# Patient Record
Sex: Female | Born: 1965 | Race: Black or African American | Hispanic: No | Marital: Single | Smoking: Never smoker
Health system: Southern US, Community
[De-identification: ages and names within clinical notes are randomized; demographics above are authoritative.]

## PROBLEM LIST (undated history)

## (undated) DIAGNOSIS — M549 Dorsalgia, unspecified: Secondary | ICD-10-CM

## (undated) DIAGNOSIS — K8020 Calculus of gallbladder without cholecystitis without obstruction: Secondary | ICD-10-CM

## (undated) DIAGNOSIS — E0500 Thyrotoxicosis with diffuse goiter without thyrotoxic crisis or storm: Secondary | ICD-10-CM

## (undated) DIAGNOSIS — M1990 Unspecified osteoarthritis, unspecified site: Secondary | ICD-10-CM

## (undated) DIAGNOSIS — K219 Gastro-esophageal reflux disease without esophagitis: Secondary | ICD-10-CM

## (undated) DIAGNOSIS — I10 Essential (primary) hypertension: Secondary | ICD-10-CM

## (undated) DIAGNOSIS — E039 Hypothyroidism, unspecified: Secondary | ICD-10-CM

## (undated) DIAGNOSIS — R609 Edema, unspecified: Secondary | ICD-10-CM

## (undated) HISTORY — PX: OTHER SURGICAL HISTORY: SHX169

## (undated) HISTORY — PX: TUBAL LIGATION: SHX77

## (undated) HISTORY — DX: Calculus of gallbladder without cholecystitis without obstruction: K80.20

## (undated) HISTORY — DX: Dorsalgia, unspecified: M54.9

## (undated) HISTORY — DX: Unspecified osteoarthritis, unspecified site: M19.90

## (undated) HISTORY — PX: CHOLECYSTECTOMY: SHX55

## (undated) HISTORY — DX: Morbid (severe) obesity due to excess calories: E66.01

---

## 2014-04-03 ENCOUNTER — Encounter (HOSPITAL_COMMUNITY): Payer: Self-pay | Admitting: Emergency Medicine

## 2014-04-03 DIAGNOSIS — R51 Headache: Secondary | ICD-10-CM | POA: Insufficient documentation

## 2014-04-03 DIAGNOSIS — H53149 Visual discomfort, unspecified: Secondary | ICD-10-CM | POA: Insufficient documentation

## 2014-04-03 DIAGNOSIS — K219 Gastro-esophageal reflux disease without esophagitis: Secondary | ICD-10-CM | POA: Insufficient documentation

## 2014-04-03 DIAGNOSIS — Z7982 Long term (current) use of aspirin: Secondary | ICD-10-CM | POA: Insufficient documentation

## 2014-04-03 DIAGNOSIS — Z79899 Other long term (current) drug therapy: Secondary | ICD-10-CM | POA: Insufficient documentation

## 2014-04-03 DIAGNOSIS — E0500 Thyrotoxicosis with diffuse goiter without thyrotoxic crisis or storm: Secondary | ICD-10-CM | POA: Insufficient documentation

## 2014-04-03 DIAGNOSIS — R110 Nausea: Secondary | ICD-10-CM | POA: Insufficient documentation

## 2014-04-03 DIAGNOSIS — E039 Hypothyroidism, unspecified: Secondary | ICD-10-CM | POA: Insufficient documentation

## 2014-04-03 DIAGNOSIS — I10 Essential (primary) hypertension: Secondary | ICD-10-CM | POA: Insufficient documentation

## 2014-04-03 NOTE — ED Notes (Signed)
Left sided H/a's. Started yesterday. Took some bayer asa and cool compress. Also. Took Excedrin. No relief.

## 2014-04-04 ENCOUNTER — Emergency Department (HOSPITAL_COMMUNITY)
Admission: EM | Admit: 2014-04-04 | Discharge: 2014-04-04 | Disposition: A | Discharge status: Home / Self Care | Payer: BC Managed Care – PPO | Attending: Emergency Medicine | Admitting: Emergency Medicine

## 2014-04-04 DIAGNOSIS — R519 Headache, unspecified: Secondary | ICD-10-CM

## 2014-04-04 DIAGNOSIS — R51 Headache: Secondary | ICD-10-CM

## 2014-04-04 HISTORY — DX: Edema, unspecified: R60.9

## 2014-04-04 HISTORY — DX: Hypothyroidism, unspecified: E03.9

## 2014-04-04 HISTORY — DX: Thyrotoxicosis with diffuse goiter without thyrotoxic crisis or storm: E05.00

## 2014-04-04 HISTORY — DX: Gastro-esophageal reflux disease without esophagitis: K21.9

## 2014-04-04 HISTORY — DX: Essential (primary) hypertension: I10

## 2014-04-04 MED ORDER — OXYCODONE HCL 5 MG PO TABS: 5.0000 mg | ORAL_TABLET | ORAL | Status: DC | PRN

## 2014-04-04 MED ORDER — HYDROMORPHONE HCL PF 1 MG/ML IJ SOLN
1.0000 mg | Freq: Once | INTRAMUSCULAR | Status: AC
  Administered 2014-04-04: 1 mg via INTRAVENOUS
  Filled 2014-04-04: qty 1

## 2014-04-04 MED ORDER — SODIUM CHLORIDE 0.9 % IV BOLUS (SEPSIS)
1000.0000 mL | Freq: Once | INTRAVENOUS | Status: AC
  Administered 2014-04-04: 1000 mL via INTRAVENOUS

## 2014-04-04 MED ORDER — NAPROXEN 500 MG PO TABS: 500.0000 mg | ORAL_TABLET | Freq: Two times a day (BID) | ORAL | Status: DC

## 2014-04-04 MED ORDER — KETOROLAC TROMETHAMINE 30 MG/ML IJ SOLN
30.0000 mg | Freq: Once | INTRAMUSCULAR | Status: AC
  Administered 2014-04-04: 30 mg via INTRAVENOUS
  Filled 2014-04-04: qty 1

## 2014-04-04 MED ORDER — ONDANSETRON HCL 4 MG/2ML IJ SOLN
4.0000 mg | Freq: Once | INTRAMUSCULAR | Status: AC
  Administered 2014-04-04: 4 mg via INTRAVENOUS
  Filled 2014-04-04: qty 2

## 2014-04-04 NOTE — ED Provider Notes (Signed)
CSN: [id number]     Arrival date & time 04/03/14  2009 History   First MD Initiated Contact with Patient 04/04/14 0206     Chief Complaint  Patient presents with  . Headache     (Consider location/radiation/quality/duration/timing/severity/associated sxs/prior Treatment) HPI Comments: 48 year old female with a frequent headache history presents with headache that started yesterday. She describes it as a pulsating left-sided headache which becomes severe at times. It waxes and wanes in intensity and it improved initially with Excedrin. The pain then recurred and has been constant since that time. The pain starts in the temporal area and radiates into the face, down into the jaw and up above the cheek. This becomes severe when the cheek is touched, when she opens her mouth or when she feels wind blow across her face. She reports a history of having headaches once or twice a year, and they do not feel like this, she denies dental pain but does have photosensitivity and nausea. She denies fevers or chills, no stiff neck, no weakness numbness or visual changes. She describes her pain as 8/10 at this time.  Patient is a 48 y.o. female presenting with headaches. The history is provided by the patient.  Headache   Past Medical History  Diagnosis Date  . Hypothyroidism   . Graves disease   . Hypertension   . Fluid retention   . Acid reflux    Past Surgical History  Procedure Laterality Date  . Cholecystectomy    . Tubal ligation     No family history on file. History  Substance Use Topics  . Smoking status: Never Smoker   . Smokeless tobacco: Not on file  . Alcohol Use: No   OB History   Grav Para Term Preterm Abortions TAB SAB Ect Mult Living                 Review of Systems  Neurological: Positive for headaches.  All other systems reviewed and are negative.     Allergies  Tylenol  Home Medications   Prior to Admission medications   Medication Sig Start Date End Date  Taking? Authorizing Provider  amLODipine (NORVASC) 5 MG tablet Take 5 mg by mouth daily.   Yes Historical Provider, MD  aspirin EC 81 MG tablet Take 162 mg by mouth once.   Yes Historical Provider, MD  aspirin-acetaminophen-caffeine (EXCEDRIN MIGRAINE) [phone] MG per tablet Take 1 tablet by mouth every 6 (six) hours as needed for headache.   Yes Historical Provider, MD  busPIRone (BUSPAR) 10 MG tablet Take 10 mg by mouth daily as needed (anxiety).   Yes Historical Provider, MD  Cholecalciferol (VITAMIN D-3) 5000 UNITS TABS Take 5,000 Units by mouth daily.   Yes Historical Provider, MD  ferrous fumarate (HEMOCYTE - 106 MG FE) 325 (106 FE) MG TABS tablet Take 1 tablet by mouth daily as needed (iron deficiency).   Yes Historical Provider, MD  hydrochlorothiazide (HYDRODIURIL) 25 MG tablet Take 25 mg by mouth daily.   Yes Historical Provider, MD  levothyroxine (SYNTHROID, LEVOTHROID) 112 MCG tablet Take 224 mcg by mouth daily before breakfast.   Yes Historical Provider, MD  pantoprazole (PROTONIX) 40 MG tablet Take 40 mg by mouth daily as needed (acid reflux).   Yes Historical Provider, MD   BP 120/72  Pulse 57  Temp(Src) 98.2 F (36.8 C) (Oral)  Resp 16  SpO2 97% Physical Exam  Nursing note and vitals reviewed. Constitutional: She appears well-developed and well-nourished. No distress.  HENT:  Head: Normocephalic and atraumatic.  Mouth/Throat: Oropharynx is clear and moist. No oropharyngeal exudate.  Severe tenderness to even light touch of the cheek, the temporal area or the lower left jaw. Dentition appears intact, mucous membranes are moist  Eyes: Conjunctivae and EOM are normal. Pupils are equal, round, and reactive to light. Right eye exhibits no discharge. Left eye exhibits no discharge. No scleral icterus.  Neck: Normal range of motion. Neck supple. No JVD present. No thyromegaly present.  Cardiovascular: Normal rate, regular rhythm, normal heart sounds and intact distal pulses.   Exam reveals no gallop and no friction rub.   No murmur heard. Pulmonary/Chest: Effort normal and breath sounds normal. No respiratory distress. She has no wheezes. She has no rales.  Abdominal: Soft. Bowel sounds are normal. She exhibits no distension and no mass. There is no tenderness.  Musculoskeletal: Normal range of motion. She exhibits no edema and no tenderness.  Lymphadenopathy:    She has no cervical adenopathy.  Neurological: She is alert. Coordination normal.  Normal speech strength and sensation, follows commands without difficulty, normal coordination, cranial nerves III through XII are intact, hypersensitivity to the left side of the face is present  Skin: Skin is warm and dry. No rash noted. No erythema.  Psychiatric: She has a normal mood and affect. Her behavior is normal.    ED Course  Procedures (including critical care time) Labs Review Labs Reviewed - No data to display  Imaging Review No results found.    MDM   Final diagnoses:  Headache    The patient has signs and symptoms consistent with a possible headache syndrome such as tic douloureux, would also consider migraine, medications as below doubt a pathologic source of the headache such as intracranial hemorrhage, mass, stroke  Improved to 2/10 after meds, referral to neuro, meds for hoem as below.  Meds given in ED:  Medications  ketorolac (TORADOL) 30 MG/ML injection 30 mg (30 mg Intravenous Given 04/04/14 0246)  HYDROmorphone (DILAUDID) injection 1 mg (1 mg Intravenous Given 04/04/14 0247)  sodium chloride 0.9 % bolus 1,000 mL (1,000 mLs Intravenous New Bag/Given 04/04/14 0246)  ondansetron (ZOFRAN) injection 4 mg (4 mg Intravenous Given 04/04/14 0244)    New Prescriptions   NAPROXEN (NAPROSYN) 500 MG TABLET    Take 1 tablet (500 mg total) by mouth 2 (two) times daily with a meal.   OXYCODONE (ROXICODONE) 5 MG IMMEDIATE RELEASE TABLET    Take 1 tablet (5 mg total) by mouth every 4 (four) hours as needed  for severe pain.        Johnna Acosta, MD 04/04/14 [phone]

## 2014-04-04 NOTE — Discharge Instructions (Signed)
Headache:  You are having a headache. No specific cause was found today for your headache. It may have been a migraine or other cause of headache. Stress, anxiety, fatigue, and depression are common triggers for headaches. Your headache today does not appear to be life-threatening or require hospitalization, but often the exact cause of headaches is not determined in the emergency department. Therefore, followup with your doctor is very important to find out what may have caused your headache, and whether or not you need any further diagnostic testing or treatment. Sometimes headaches can appear benign but then more serious symptoms can develop which should prompt an immediate reevaluation by your doctor or the emergency department.  Seek immediate medical attention if:  You develop possible problems with medications prescribed. The medications don't resolve your headache, if it recurs, or if you have multiple episodes of vomiting or can't take fluids by mouth You have a change from the usual headache. If you developed a sudden severe headache or confusion, become poorly responsive or faint, developed a fever above 100.4 or problems breathing, have a change in speech, vision, swallowing or understanding, or developed new weakness, numbness, tingling, incoordination or have a seizure.  If you don't have a family doctor to follow up with, see the follow up list below - call this morning for a follow-up appointment in the next 1-2 days.  RESOURCE GUIDE  Dental Problems  Patients with Medicaid: [facility]                     [facility] [address]                                           [address] Phone:  [phone]                                                  Phone:  [phone]  If unable to pay or uninsured, contact:  [facility] or [facility] to become qualified for the adult dental clinic.  Chronic Pain Problems Contact [facility]  [phone] Patients need to be referred by their primary care doctor.  Insufficient Money for Medicine Contact [employer]:  call "211" or [facility] 271-5999.  No Primary Care Doctor Call [facility]  832-8000 Other agencies that provide inexpensive medical care    [facility]  [phone]    [facility]  [phone]    [facility]  [phone]    [facility] Clinic  [phone]    [facility]  [phone]    [facility]  [phone]  Psychological Services [facility]  [phone] [facility]  [phone] [facility]   800 853-5163 (emergency services [phone])  Substance Abuse Resources [facility]  336-882-2125 [facility] 336-784-9470 [facility] 336-285-9073 [facility] 336-845-3988 Residential & Outpatient Substance Abuse Program  800-659-3381  Abuse/Neglect [place] Child Abuse Hotline (336) 641-3795 [place] Child Abuse Hotline 800-378-5315 (After Hours)  Emergency Shelter [facility] (336) 271-5985  [facility] (336) 275-9566 [facility] (704) 372-4663  MRSA Hotline #:     832-7006    [place] [facility]  [facility] [place]     [employer]                          [facility] [address] [place]                       [address]      [address]  [place]                                                [place]                            [place] Phone:  [phone]                                   Phone:  [phone]                 Phone:  [phone]  [facility] Phone:  [phone]  [place] Child Abuse Hotline (336) 342-1394 (336) 342-3537 (After Hours)   

## 2014-04-04 NOTE — ED Notes (Signed)
MD at bedside. 

## 2015-06-23 ENCOUNTER — Encounter: Payer: Self-pay | Admitting: Gastroenterology

## 2015-06-23 ENCOUNTER — Telehealth: Payer: Self-pay | Admitting: Adult Health

## 2015-06-23 ENCOUNTER — Encounter: Payer: Self-pay | Admitting: Adult Health

## 2015-06-23 ENCOUNTER — Ambulatory Visit (INDEPENDENT_AMBULATORY_CARE_PROVIDER_SITE_OTHER): Payer: No Typology Code available for payment source | Admitting: Adult Health

## 2015-06-23 VITALS — BP 120/70 | Temp 98.4°F | Ht 66.0 in | Wt 307.9 lb

## 2015-06-23 DIAGNOSIS — Z7689 Persons encountering health services in other specified circumstances: Secondary | ICD-10-CM

## 2015-06-23 DIAGNOSIS — I10 Essential (primary) hypertension: Secondary | ICD-10-CM | POA: Diagnosis not present

## 2015-06-23 DIAGNOSIS — E039 Hypothyroidism, unspecified: Secondary | ICD-10-CM | POA: Diagnosis not present

## 2015-06-23 DIAGNOSIS — Z23 Encounter for immunization: Secondary | ICD-10-CM

## 2015-06-23 DIAGNOSIS — Z1239 Encounter for other screening for malignant neoplasm of breast: Secondary | ICD-10-CM

## 2015-06-23 LAB — TSH: TSH: 1.51 u[IU]/mL (ref 0.35–4.50)

## 2015-06-23 NOTE — Progress Notes (Signed)
HPI:  Cheryl Stone is here to establish care. She is a pleasant AA, non smoking female with  has a past medical history of Hypothyroidism; Graves disease; Hypertension; Fluid retention; and Acid reflux.  Last PCP and physical:Unknown  Has the following chronic problems that require follow up and concerns today:  Hypothyroidism  - She had Graves Disease in 1999 and was treated with two rounds of iodine. This caused her to become hypo and she started taking synthroid, her levels are not well controlled. She continues to be fatigued and chilled.   Left Knee Pain  - for 5.5 years. Takes Naproxen, which does not help. She is inquiring about injections.  HTN - Endorses being well controlled on current medications.     ROS negative for unless reported above: fevers, chills,feeling poorly, unintentional weight loss, hearing or vision loss, chest pain, palpitations, leg claudication, struggling to breath,Not feeling congested in the chest, no orthopenia, no cough,no wheezing, normal appetite, no soft tissue swelling, no hemoptysis, melena, hematochezia, hematuria, falls, loc, si, or thoughts of self harm.  Immunizations:UTD Diet: Eats healthy Exercise:Walks and other cardiology  Colonoscopy:Scheduled Pap Smear: No abnormal. Will schedule a gyn appointment Mammogram: Scheduled  Past Medical History  Diagnosis Date  . Hypothyroidism   . Graves disease   . Hypertension   . Fluid retention   . Acid reflux     Past Surgical History  Procedure Laterality Date  . Cholecystectomy    . Tubal ligation    . Ablation      History reviewed. No pertinent family history.  History   Social History  . Marital Status: Single    Spouse Name: N/A  . Number of Children: N/A  . Years of Education: N/A   Social History Main Topics  . Smoking status: Never Smoker   . Smokeless tobacco: Not on file  . Alcohol Use: No  . Drug Use: No  . Sexual Activity: Not on file   Other Topics  Concern  . None   Social History Narrative     Current outpatient prescriptions:  .  amLODipine (NORVASC) 5 MG tablet, Take 5 mg by mouth daily., Disp: , Rfl:  .  Cholecalciferol (D-5000) 5000 UNITS TABS, Take 1 tablet by mouth daily., Disp: , Rfl:  .  ferrous fumarate (HEMOCYTE - 106 MG FE) 325 (106 FE) MG TABS tablet, Take 1 tablet by mouth daily as needed (iron deficiency)., Disp: , Rfl:  .  hydrochlorothiazide (HYDRODIURIL) 25 MG tablet, Take 25 mg by mouth daily., Disp: , Rfl:  .  levothyroxine (SYNTHROID, LEVOTHROID) 112 MCG tablet, Take 224 mcg by mouth daily before breakfast., Disp: , Rfl:  .  aspirin EC 81 MG tablet, Take 162 mg by mouth once., Disp: , Rfl:  .  Cholecalciferol (VITAMIN D-3) 5000 UNITS TABS, Take 5,000 Units by mouth daily., Disp: , Rfl:  .  famotidine (PEPCID) 20 MG tablet, Take 1 tablet by mouth 2 (two) times daily., Disp: , Rfl:  .  naproxen (NAPROSYN) 500 MG tablet, Take 1 tablet (500 mg total) by mouth 2 (two) times daily with a meal., Disp: 30 tablet, Rfl: 0  EXAM:  Filed Vitals:   06/23/15 1357  BP: 120/70  Temp: 98.4 F (36.9 C)    Body mass index is 49.72 kg/(m^2).  GENERAL: vitals reviewed and listed above, alert, oriented, appears well hydrated and in no acute distress. Severely obese.  HEENT: atraumatic, conjunttiva clear, no obvious abnormalities on inspection of external nose and  ears. Multiple missing teeth  NECK: Neck is soft and supple without masses, no adenopathy or thyromegaly, trachea midline, no JVD. Normal range of motion.   LUNGS: clear to auscultation bilaterally, no wheezes, rales or rhonchi, good air movement  CV: Regular rate and rhythm, normal S1/S2, no audible murmurs, gallops, or rubs. No carotid bruit and no peripheral edema.   MS: moves all extremities without noticeable abnormality. No edema noted. Pain to right knee, right medial aspect with palpation. No bruising, swelling or warmth noted  Abd:  soft/nontender/nondistended/normal bowel sounds   Skin: warm and dry, no rash   Extremities: No clubbing, cyanosis, or edema. Capillary refill is WNL. Pulses intact bilaterally in upper and lower extremities.   Neuro: CN II-XII intact, sensation and reflexes normal throughout, 5/5 muscle strength in bilateral upper and lower extremities. Normal finger to nose. Normal rapid alternating movements.   PSYCH: pleasant and cooperative, no obvious depression or anxiety  ASSESSMENT AND PLAN:   1. Encounter to establish care - Follow up in as soon as possible for CPE - Ambulatory referral to Gastroenterology  2. Essential hypertension - Controlled with current medication regimen  3. Hypothyroidism, unspecified hypothyroidism type - TSH - Will follow up on labs - Consider Endocrine referral.   4. Breast cancer screening - MM Digital Screening; Future    No diagnosis found. -We reviewed the PMH, PSH, FH, SH, Meds and Allergies. -We provided refills for any medications we will prescribe as needed. -We addressed current concerns per orders and patient instructions. -We have asked for records for pertinent exams, studies, vaccines and notes from previous providers. -We have advised patient to follow up per instructions below.   -Patient advised to return or notify a provider immediately if symptoms worsen or persist or new concerns arise.  There are no Patient Instructions on file for this visit.   [employer]

## 2015-06-23 NOTE — Patient Instructions (Signed)
It was great meeting you today and I am happy to have you on the team!   I will follow up with you regarding your labs.    Please follow up with me at your convenience for your physical.   If you need anything in the mean time, please let me know.

## 2015-06-23 NOTE — Telephone Encounter (Signed)
Patient informed on her TSH level. No change in medications.

## 2015-07-06 ENCOUNTER — Telehealth: Payer: Self-pay | Admitting: Adult Health

## 2015-07-06 NOTE — Telephone Encounter (Signed)
Pt said she saw Tommi Rumps for the first time a couple weeks ago and is asking if he will refill the following meds   levothyroxine (SYNTHROID, LEVOTHROID) 112 MCG tablet , hydrochlorothiazide (HYDRODIURIL) 25 MG tablet  amLODipine (NORVASC) 5 MG tablet    [facility]

## 2015-07-07 MED ORDER — AMLODIPINE BESYLATE 5 MG PO TABS: 5.0000 mg | ORAL_TABLET | Freq: Every day | ORAL | Status: DC

## 2015-07-07 MED ORDER — LEVOTHYROXINE SODIUM 112 MCG PO TABS: 224.0000 ug | ORAL_TABLET | Freq: Every day | ORAL | Status: DC

## 2015-07-07 MED ORDER — HYDROCHLOROTHIAZIDE 25 MG PO TABS: 25.0000 mg | ORAL_TABLET | Freq: Every day | ORAL | Status: DC

## 2015-07-07 NOTE — Telephone Encounter (Signed)
Rx sent to pharmacy   

## 2015-07-09 ENCOUNTER — Encounter: Payer: Self-pay | Admitting: Adult Health

## 2015-07-10 ENCOUNTER — Telehealth: Payer: Self-pay | Admitting: Adult Health

## 2015-07-10 NOTE — Telephone Encounter (Signed)
Patient called in wanting to know if the MD would be able to call in her medications for thyroid(Synthryoid), Norvas, and [facility] to [facility] [address]. Please give patient a call.

## 2015-07-10 NOTE — Telephone Encounter (Signed)
Called and spoke with Roseanne and the prescriptions are ready for pick up.  Rx was sent electronically on 07/07/2015.  Pt needs to bring in her insurance card.  Called and spoke with pt and pt is aware. Pt states she will call gynecology to make an appt for a pap.  Pt will come in on 8.24.2016 for a annual exam with [facility].

## 2015-07-29 ENCOUNTER — Other Ambulatory Visit (HOSPITAL_COMMUNITY)
Admission: RE | Admit: 2015-07-29 | Discharge: 2015-07-29 | Disposition: A | Discharge status: Home / Self Care | Payer: No Typology Code available for payment source | Source: Ambulatory Visit | Attending: Adult Health | Admitting: Adult Health

## 2015-07-29 ENCOUNTER — Ambulatory Visit (INDEPENDENT_AMBULATORY_CARE_PROVIDER_SITE_OTHER): Payer: No Typology Code available for payment source | Admitting: Adult Health

## 2015-07-29 ENCOUNTER — Telehealth: Payer: Self-pay

## 2015-07-29 ENCOUNTER — Encounter: Payer: Self-pay | Admitting: Adult Health

## 2015-07-29 VITALS — BP 122/80 | Temp 98.3°F | Ht 66.0 in | Wt 305.2 lb

## 2015-07-29 DIAGNOSIS — Z01419 Encounter for gynecological examination (general) (routine) without abnormal findings: Secondary | ICD-10-CM | POA: Insufficient documentation

## 2015-07-29 DIAGNOSIS — Z0000 Encounter for general adult medical examination without abnormal findings: Secondary | ICD-10-CM | POA: Diagnosis not present

## 2015-07-29 DIAGNOSIS — M25562 Pain in left knee: Secondary | ICD-10-CM

## 2015-07-29 DIAGNOSIS — Z23 Encounter for immunization: Secondary | ICD-10-CM | POA: Diagnosis not present

## 2015-07-29 DIAGNOSIS — E039 Hypothyroidism, unspecified: Secondary | ICD-10-CM

## 2015-07-29 DIAGNOSIS — Z113 Encounter for screening for infections with a predominantly sexual mode of transmission: Secondary | ICD-10-CM | POA: Insufficient documentation

## 2015-07-29 DIAGNOSIS — I10 Essential (primary) hypertension: Secondary | ICD-10-CM

## 2015-07-29 DIAGNOSIS — Z1239 Encounter for other screening for malignant neoplasm of breast: Secondary | ICD-10-CM

## 2015-07-29 DIAGNOSIS — Z0001 Encounter for general adult medical examination with abnormal findings: Secondary | ICD-10-CM

## 2015-07-29 DIAGNOSIS — N760 Acute vaginitis: Secondary | ICD-10-CM | POA: Insufficient documentation

## 2015-07-29 DIAGNOSIS — M25561 Pain in right knee: Secondary | ICD-10-CM

## 2015-07-29 MED ORDER — MELOXICAM 15 MG PO TABS: 15.0000 mg | ORAL_TABLET | Freq: Every day | ORAL | Status: DC | PRN

## 2015-07-29 MED ORDER — PHENTERMINE HCL 15 MG PO CAPS: 15.0000 mg | ORAL_CAPSULE | ORAL | Status: DC

## 2015-07-29 NOTE — Progress Notes (Addendum)
Subjective:    Patient ID: Cheryl Stone, female    DOB: 07-02-1966, 49 y.o.   MRN: [id number]  HPI  49 year old female who presents today for her complete physical. She  has a past medical history of Hypothyroidism; Graves disease; Hypertension; Fluid retention; Acid reflux; Back pain; and Osteoarthritis.  She endorses that everything is going well except for her weight loss goals. She is exercising and is eating a healthy diet but does not seem to be losing weight. She is down two pounds since last visit. She would like to try other therapies before inquiring about gastric bypass surgery.   She has her colonoscopy scheduled for next month and is going to schedule her breast exam.   Her only other complaint is continued left and  right knee pain.Her knee pain originally started in the left knee a few months ago, she has been compensating with her right knee which has caused it to hurt.  She is inquiring about injections or pain medication she can take for this. She has tried tylenol and ibuprofen in the past without much success. She has full range of motion and is able to bare weight without difficulty.     Review of Systems  Constitutional: Negative.   HENT: Negative.   Eyes: Negative.   Respiratory: Negative.   Cardiovascular: Positive for leg swelling.  Gastrointestinal: Negative.   Endocrine: Negative.   Genitourinary: Negative.   Musculoskeletal: Positive for myalgias and arthralgias. Negative for back pain, gait problem, neck pain and neck stiffness.  Skin: Negative.   Allergic/Immunologic: Negative.   Neurological: Negative.   Hematological: Negative.   Psychiatric/Behavioral: Negative.   All other systems reviewed and are negative.      Objective:   Physical Exam  GENERAL: vitals reviewed and listed above, alert, oriented, appears well hydrated and in no acute distress. Severely obese.  HEENT: atraumatic, conjunttiva clear, no obvious abnormalities on inspection of  external nose and ears. Multiple missing teeth  NECK: Neck is soft and supple without masses, no adenopathy or thyromegaly, trachea midline, no JVD. Normal range of motion.   LUNGS: clear to auscultation bilaterally, no wheezes, rales or rhonchi, good air movement  CV: Regular rate and rhythm, normal S1/S2, no audible murmurs, gallops, or rubs. No carotid bruit and no peripheral edema.   MS: moves all extremities without noticeable abnormality. No edema noted. Pain to right and left knees, right medial aspect with palpation. No bruising, swelling or warmth noted  Abd: soft/nontender/nondistended/normal bowel sounds   Genitourinary: Uterus normal. Guaiac negative stool. Vaginal discharge (on menstral cycle) found.  Breast Exam: No lumps, masses, discharge, or dimpling noted. Soreness mid sternal left breast.     Skin: warm and dry, no rash   Extremities: No clubbing, cyanosis, or edema. Capillary refill is WNL. Pulses intact bilaterally in upper and lower extremities.   Neuro: CN II-XII intact, sensation and reflexes normal throughout, 5/5 muscle strength in bilateral upper and lower extremities. Normal finger to nose. Normal rapid alternating movements.   PSYCH: pleasant and cooperative, no obvious depression or anxiety      Assessment & Plan:  1. Annual physical exam - Basic metabolic panel - CBC with Differential/Platelet - Hemoglobin A1c - Hepatic function panel - Lipid panel - TSH - POCT urinalysis dipstick - EKG 12-Lead-- NSR, Rate 65 - Acute Hep Panel & Hep B Surface Ab - HIV antibody - HSV(herpes smplx)abs-1+2(IgG+IgM)-bld - RPR - Cytology - PAP  2. Essential hypertension - Continue with  current medication therapy-  no change - Basic metabolic panel - Hemoglobin A1c - Lipid panel - EKG 12-Lead  3. Hypothyroidism, unspecified hypothyroidism type - TSH - Consider changing dose once labs are back  4. Breast cancer screening - MM DIGITAL SCREENING BILATERAL;  Future  5. Morbid obesity - phentermine 15 MG capsule; Take 1 capsule (15 mg total) by mouth every morning.  Dispense: 30 capsule; Refill: 0 - Will use for 12 weeks depending on weight loss - Consider referral to bariatric RN  7. Arthralgia of both knees - meloxicam (MOBIC) 15 MG tablet; Take 1 tablet (15 mg total) by mouth daily as needed for pain.  Dispense: 30 tablet; Refill: 3 - DG Knee 1-2 Views Left; Future - Will consider steroid injections

## 2015-07-29 NOTE — Telephone Encounter (Signed)
Pt called and would like to know what you decided on the knee injection? Pt wanted to know if she could have something for pain that will not make her feel bad. Pls advise.

## 2015-07-29 NOTE — Progress Notes (Signed)
Pre visit review using our clinic review tool, if applicable. No additional management support is needed unless otherwise documented below in the visit note. 

## 2015-07-29 NOTE — Telephone Encounter (Signed)
Called and spoke with pt and pt is aware.  Pt states the knee problems started with the left knee initially, and now the right hurts a little due to pt compensating. Advised pt that an order for her knee would be ordered.  [facility]

## 2015-07-29 NOTE — Addendum Note (Signed)
Addended by: Apolinar Junes on: 07/29/2015 11:45 AM   Modules accepted: Orders, SmartSet

## 2015-07-29 NOTE — Patient Instructions (Signed)
It was great seeing you again!  I will follow up with you regarding your blood work.   Start the phentermine and continue with diet and exercise. If you have any chest pain, dizziness or SOB stop the medication. If you feel jittery, then cut the pill in half and take one half before breakfast and at lunch.   Let me know if you need anything!

## 2015-07-29 NOTE — Telephone Encounter (Signed)
We can do an injection and see if it helps. I would like her to go get an xray of her knee first. I will send in Mobic 15 mg, take daily as needed for pain.  If she wants to get the xray, let me know and I will put it in.   Thanks

## 2015-07-30 LAB — CYTOLOGY - PAP

## 2015-07-31 ENCOUNTER — Telehealth: Payer: Self-pay | Admitting: Adult Health

## 2015-07-31 ENCOUNTER — Other Ambulatory Visit: Payer: Self-pay | Admitting: Adult Health

## 2015-07-31 LAB — CERVICOVAGINAL ANCILLARY ONLY
BACTERIAL VAGINITIS: NEGATIVE
Candida vaginitis: NEGATIVE

## 2015-07-31 MED ORDER — METRONIDAZOLE 500 MG PO TABS: ORAL_TABLET | ORAL | Status: DC

## 2015-07-31 NOTE — Telephone Encounter (Signed)
Spoke to patient on the phone and advised her of the positive Trichomonas result. Sent in 2 g Flagyl one time dose. Advised that other partners should be treated and not to drink any alcohol for 72 hours after taking.

## 2015-08-03 ENCOUNTER — Encounter: Payer: Self-pay | Admitting: Obstetrics and Gynecology

## 2015-08-04 ENCOUNTER — Telehealth: Payer: Self-pay

## 2015-08-04 NOTE — Telephone Encounter (Signed)
Called and spoke with pt and pt is aware.  Lab appt made of 8.31.2016. Pt is aware.

## 2015-08-04 NOTE — Telephone Encounter (Signed)
-----   Message from Dorothyann Peng, NP sent at 08/04/2015  7:51 AM EDT ----- She never had her blood work drawn. Can we get her to come back one morning and do this?

## 2015-08-05 ENCOUNTER — Encounter: Payer: Self-pay | Admitting: Adult Health

## 2015-08-05 ENCOUNTER — Other Ambulatory Visit: Payer: No Typology Code available for payment source

## 2015-08-05 LAB — HEPATIC FUNCTION PANEL
ALK PHOS: 53 U/L (ref 39–117)
ALT: 12 U/L (ref 0–35)
AST: 14 U/L (ref 0–37)
Albumin: 3.8 g/dL (ref 3.5–5.2)
BILIRUBIN DIRECT: 0.1 mg/dL (ref 0.0–0.3)
BILIRUBIN TOTAL: 0.4 mg/dL (ref 0.2–1.2)
Total Protein: 7.8 g/dL (ref 6.0–8.3)

## 2015-08-05 LAB — BASIC METABOLIC PANEL
BUN: 15 mg/dL (ref 6–23)
CO2: 32 mEq/L (ref 19–32)
CREATININE: 0.77 mg/dL (ref 0.40–1.20)
Calcium: 9.5 mg/dL (ref 8.4–10.5)
Chloride: 100 mEq/L (ref 96–112)
GFR: 101.7 mL/min (ref >60.00)
Glucose, Bld: 92 mg/dL (ref 70–99)
Potassium: 3.4 mEq/L — ABNORMAL LOW (ref 3.5–5.1)
Sodium: 139 mEq/L (ref 135–145)

## 2015-08-05 LAB — LIPID PANEL
CHOL/HDL RATIO: 4
CHOLESTEROL: 171 mg/dL (ref 0–200)
HDL: 42.8 mg/dL (ref >39.00)
LDL CALC: 113 mg/dL — AB (ref 0–99)
NONHDL: 127.88
Triglycerides: 73 mg/dL (ref 0.0–149.0)
VLDL: 14.6 mg/dL (ref 0.0–40.0)

## 2015-08-05 LAB — POCT URINALYSIS DIPSTICK
BILIRUBIN UA: NEGATIVE
Blood, UA: NEGATIVE
Glucose, UA: NEGATIVE
KETONES UA: NEGATIVE
Leukocytes, UA: NEGATIVE
Nitrite, UA: NEGATIVE
PH UA: 7
Protein, UA: NEGATIVE
SPEC GRAV UA: 1.015
Urobilinogen, UA: 0.2

## 2015-08-05 LAB — CBC WITH DIFFERENTIAL/PLATELET
BASOS ABS: 0 10*3/uL (ref 0.0–0.1)
Basophils Relative: 0.4 % (ref 0.0–3.0)
EOS ABS: 0.1 10*3/uL (ref 0.0–0.7)
Eosinophils Relative: 2.3 % (ref 0.0–5.0)
HCT: 44.6 % (ref 36.0–46.0)
HEMOGLOBIN: 15.1 g/dL — AB (ref 12.0–15.0)
Lymphocytes Relative: 43.5 % (ref 12.0–46.0)
Lymphs Abs: 1.4 10*3/uL (ref 0.7–4.0)
MCHC: 33.8 g/dL (ref 30.0–36.0)
MCV: 83.2 fl (ref 78.0–100.0)
MONO ABS: 0.2 10*3/uL (ref 0.1–1.0)
Monocytes Relative: 7.5 % (ref 3.0–12.0)
Neutro Abs: 1.5 10*3/uL (ref 1.4–7.7)
Neutrophils Relative %: 46.3 % (ref 43.0–77.0)
Platelets: 201 10*3/uL (ref 150.0–400.0)
RBC: 5.36 Mil/uL — ABNORMAL HIGH (ref 3.87–5.11)
RDW: 15.5 % (ref 11.5–15.5)
WBC: 3.2 10*3/uL — AB (ref 4.0–10.5)

## 2015-08-05 LAB — TSH: TSH: 0.48 u[IU]/mL (ref 0.35–4.50)

## 2015-08-05 LAB — HEMOGLOBIN A1C: HEMOGLOBIN A1C: 5.8 % (ref 4.6–6.5)

## 2015-08-06 LAB — ACUTE HEP PANEL AND HEP B SURFACE AB
HCV Ab: NEGATIVE
HEP A IGM: NONREACTIVE
HEP B S AG: NEGATIVE
Hep B C IgM: NONREACTIVE
Hep B S Ab: NEGATIVE

## 2015-08-06 LAB — HIV ANTIBODY (ROUTINE TESTING W REFLEX): HIV 1&2 Ab, 4th Generation: NONREACTIVE

## 2015-08-06 LAB — RPR

## 2015-08-07 LAB — HSV(HERPES SMPLX)ABS-I+II(IGG+IGM)-BLD
HSV 1 Glycoprotein G Ab, IgG: 6.99 IV — ABNORMAL HIGH
HSV 2 GLYCOPROTEIN G AB, IGG: 2.66 IV — AB
Herpes Simplex Vrs I&II-IgM Ab (EIA): 0.59 INDEX

## 2015-08-09 ENCOUNTER — Encounter: Payer: Self-pay | Admitting: Adult Health

## 2015-08-19 ENCOUNTER — Ambulatory Visit (AMBULATORY_SURGERY_CENTER): Payer: Self-pay

## 2015-08-19 VITALS — Ht 66.0 in | Wt 303.8 lb

## 2015-08-19 DIAGNOSIS — Z1211 Encounter for screening for malignant neoplasm of colon: Secondary | ICD-10-CM

## 2015-08-19 MED ORDER — MOVIPREP 100 G PO SOLR: 1.0000 | Freq: Once | ORAL | Status: DC

## 2015-08-19 NOTE — Progress Notes (Signed)
No allergies to eggs or soy No past problems with anesthesia No home oxygen DOES TAKE PHENTERMINE; TOLD TO STOP FOR 10 DAYS  Has email  Emmi instructions given for colonoscopy

## 2015-09-02 ENCOUNTER — Ambulatory Visit (AMBULATORY_SURGERY_CENTER): Payer: No Typology Code available for payment source | Admitting: Gastroenterology

## 2015-09-02 ENCOUNTER — Encounter: Payer: Self-pay | Admitting: Gastroenterology

## 2015-09-02 VITALS — BP 112/74 | HR 60 | Temp 97.5°F | Resp 20 | Ht 66.0 in | Wt 303.0 lb

## 2015-09-02 DIAGNOSIS — D123 Benign neoplasm of transverse colon: Secondary | ICD-10-CM | POA: Diagnosis not present

## 2015-09-02 DIAGNOSIS — Z1211 Encounter for screening for malignant neoplasm of colon: Secondary | ICD-10-CM | POA: Diagnosis not present

## 2015-09-02 DIAGNOSIS — D125 Benign neoplasm of sigmoid colon: Secondary | ICD-10-CM

## 2015-09-02 MED ORDER — SODIUM CHLORIDE 0.9 % IV SOLN: 500.0000 mL | INTRAVENOUS | Status: DC

## 2015-09-02 NOTE — Progress Notes (Signed)
Called to room to assist during endoscopic procedure.  Patient ID and intended procedure confirmed with present staff. Received instructions for my participation in the procedure from the performing physician.  

## 2015-09-02 NOTE — Patient Instructions (Signed)
YOU HAD AN ENDOSCOPIC PROCEDURE TODAY AT THE [facility]:   Refer to the procedure report that was given to you for any specific questions about what was found during the examination.  If the procedure report does not answer your questions, please call your gastroenterologist to clarify.  If you requested that your care partner not be given the details of your procedure findings, then the procedure report has been included in a sealed envelope for you to review at your convenience later.  YOU SHOULD EXPECT: Some feelings of bloating in the abdomen. Passage of more gas than usual.  Walking can help get rid of the air that was put into your GI tract during the procedure and reduce the bloating. If you had a lower endoscopy (such as a colonoscopy or flexible sigmoidoscopy) you may notice spotting of blood in your stool or on the toilet paper. If you underwent a bowel prep for your procedure, you may not have a normal bowel movement for a few days.  Please Note:  You might notice some irritation and congestion in your nose or some drainage.  This is from the oxygen used during your procedure.  There is no need for concern and it should clear up in a day or so.  SYMPTOMS TO REPORT IMMEDIATELY:   Following lower endoscopy (colonoscopy or flexible sigmoidoscopy):  Excessive amounts of blood in the stool  Significant tenderness or worsening of abdominal pains  Swelling of the abdomen that is new, acute  Fever of 100F or higher  For urgent or emergent issues, a gastroenterologist can be reached at any hour by calling [phone].   DIET: Your first meal following the procedure should be a small meal and then it is ok to progress to your normal diet. Heavy or fried foods are harder to digest and may make you feel nauseous or bloated.  Likewise, meals heavy in dairy and vegetables can increase bloating.  Drink plenty of fluids but you should avoid alcoholic beverages for 24  hours.  ACTIVITY:  You should plan to take it easy for the rest of today and you should NOT DRIVE or use heavy machinery until tomorrow (because of the sedation medicines used during the test).    FOLLOW UP: Our staff will call the number listed on your records the next business day following your procedure to check on you and address any questions or concerns that you may have regarding the information given to you following your procedure. If we do not reach you, we will leave a message.  However, if you are feeling well and you are not experiencing any problems, there is no need to return our call.  We will assume that you have returned to your regular daily activities without incident.  If any biopsies were taken you will be contacted by phone or by letter within the next 1-3 weeks.  Please call us at [phone] if you have not heard about the biopsies in 3 weeks.    SIGNATURES/CONFIDENTIALITY: You and/or your care partner have signed paperwork which will be entered into your electronic medical record.  These signatures attest to the fact that that the information above on your After Visit Summary has been reviewed and is understood.  Full responsibility of the confidentiality of this discharge information lies with you and/or your care-partner.  Please review polyp handout provided. Next colonoscopy determined by pathology results; 5 or 10 years. 

## 2015-09-02 NOTE — Op Note (Signed)
[place]  [address] [place], [id number]   COLONOSCOPY PROCEDURE REPORT  PATIENT: Cheryl Stone, Cheryl Stone  MR#: [id number] BIRTHDATE: 03/25/1966 , 48  yrs. old GENDER: female ENDOSCOPIST: Milus Banister, MD REFERRED XL:KGMW Nafzinger, MD PROCEDURE DATE:  09/02/2015 PROCEDURE:   Colonoscopy, screening, Colonoscopy with biopsy, and Colonoscopy with snare polypectomy First Screening Colonoscopy - Avg.  risk and is 50 yrs.  old or older Yes.  Prior Negative Screening - Now for repeat screening. N/A  History of Adenoma - Now for follow-up colonoscopy & has been > or = to 3 yrs.  N/A  Polyps removed today? Yes ASA CLASS:   Class II INDICATIONS:Screening for colonic neoplasia and average risk patient for colon cancer. MEDICATIONS: Monitored anesthesia care and Propofol 200 mg IV  DESCRIPTION OF PROCEDURE:   After the risks benefits and alternatives of the procedure were thoroughly explained, informed consent was obtained.  The digital rectal exam revealed no abnormalities of the rectum.   The LB [id number]  endoscope was introduced through the anus and advanced to the cecum, which was identified by both the appendix and ileocecal valve. No adverse events experienced.   The quality of the prep was excellent.  The instrument was then slowly withdrawn as the colon was fully examined. Estimated blood loss is zero unless otherwise noted in this procedure report.  COLON FINDINGS: A sessile polyp measuring 3 mm in size was found in the transverse colon.  A polypectomy was performed with a cold snare.  The resection was complete, the polyp tissue was completely retrieved and sent to histology.   A sessile polyp measuring 2 mm in size was found in the sigmoid colon.  A polypectomy was performed with cold forceps.  The resection was complete, the polyp tissue was completely retrieved and sent to histology.   The examination was otherwise normal.  Retroflexed views revealed  no abnormalities. The time to cecum = 0.9 Withdrawal time = 10.0   The scope was withdrawn and the procedure completed. COMPLICATIONS: There were no immediate complications.  ENDOSCOPIC IMPRESSION: 1.   Sessile polyp was found in the transverse colon; polypectomy was performed with a cold snare 2.   Sessile polyp was found in the sigmoid colon; polypectomy was performed with cold forceps 3.   The examination was otherwise normal  RECOMMENDATIONS: If the polyp(s) removed today are proven to be adenomatous (pre-cancerous) polyps, you will need a repeat colonoscopy in 5 years.  Otherwise you should continue to follow colorectal cancer screening guidelines for "routine risk" patients with colonoscopy in 10 years.  You will receive a letter within 1-2 weeks with the results of your biopsy as well as final recommendations.  Please call my office if you have not received a letter after 3 weeks.  eSigned:  Milus Banister, MD 09/02/2015 10:07 AM

## 2015-09-02 NOTE — Progress Notes (Signed)
Report to PACU, RN, vss, BBS= Clear.  

## 2015-09-03 ENCOUNTER — Telehealth: Payer: Self-pay | Admitting: *Deleted

## 2015-09-03 NOTE — Telephone Encounter (Signed)
  Follow up Call-  Call back number 09/02/2015  Post procedure Call Back phone  # [phone]  Permission to leave phone message Yes     Patient questions:  Do you have a fever, pain , or abdominal swelling? No. Pain Score  0 *  Have you tolerated food without any problems? Yes.    Have you been able to return to your normal activities? Yes.    Do you have any questions about your discharge instructions: Diet   No. Medications  No. Follow up visit  No.  Do you have questions or concerns about your Care? No.  Actions: * If pain score is 4 or above: No action needed, pain <4.

## 2015-09-07 ENCOUNTER — Other Ambulatory Visit: Payer: Self-pay | Admitting: Adult Health

## 2015-09-07 ENCOUNTER — Encounter: Payer: Self-pay | Admitting: Gastroenterology

## 2015-09-07 NOTE — Telephone Encounter (Signed)
Please advise. Sent pt a message to ask her current weight and if she was having any side effects.

## 2015-09-08 NOTE — Telephone Encounter (Signed)
Pls advise.  

## 2015-09-09 MED ORDER — PHENTERMINE HCL 37.5 MG PO CAPS: 37.5000 mg | ORAL_CAPSULE | ORAL | Status: DC

## 2015-09-09 MED ORDER — PHENTERMINE HCL 15 MG PO CAPS: 15.0000 mg | ORAL_CAPSULE | ORAL | Status: DC

## 2015-09-09 NOTE — Telephone Encounter (Signed)
D/c phentermine 15 mg and ordered 37.5.

## 2015-09-09 NOTE — Telephone Encounter (Signed)
Increase to 37.5 mg

## 2015-09-09 NOTE — Addendum Note (Signed)
Addended by: Colleen Can on: 09/09/2015 09:52 AM   Modules accepted: Orders, Medications

## 2015-09-18 ENCOUNTER — Encounter: Payer: Self-pay | Admitting: Obstetrics and Gynecology

## 2015-10-01 ENCOUNTER — Other Ambulatory Visit: Payer: Self-pay | Admitting: Adult Health

## 2015-10-14 ENCOUNTER — Encounter: Payer: Self-pay | Admitting: Adult Health

## 2015-10-15 ENCOUNTER — Other Ambulatory Visit: Payer: Self-pay | Admitting: Adult Health

## 2015-10-15 MED ORDER — PHENTERMINE HCL 30 MG PO CAPS: 30.0000 mg | ORAL_CAPSULE | ORAL | Status: DC

## 2015-11-05 ENCOUNTER — Encounter: Payer: Self-pay | Admitting: Obstetrics and Gynecology

## 2015-11-20 ENCOUNTER — Encounter: Payer: Self-pay | Admitting: Obstetrics and Gynecology

## 2015-11-23 ENCOUNTER — Encounter: Payer: Self-pay | Admitting: Adult Health

## 2016-01-09 ENCOUNTER — Other Ambulatory Visit: Payer: Self-pay | Admitting: Adult Health

## 2016-01-12 ENCOUNTER — Other Ambulatory Visit: Payer: Self-pay | Admitting: Adult Health

## 2016-01-13 NOTE — Telephone Encounter (Signed)
Ok to refill 

## 2016-01-13 NOTE — Telephone Encounter (Signed)
Ok to refill for 6 months 

## 2016-01-15 ENCOUNTER — Encounter: Payer: Self-pay | Admitting: Endocrinology

## 2016-01-15 ENCOUNTER — Ambulatory Visit (INDEPENDENT_AMBULATORY_CARE_PROVIDER_SITE_OTHER): Payer: BLUE CROSS/BLUE SHIELD | Admitting: Endocrinology

## 2016-01-15 VITALS — BP 120/86 | HR 93 | Temp 98.3°F | Resp 20 | Ht 66.0 in | Wt 309.0 lb

## 2016-01-15 DIAGNOSIS — E041 Nontoxic single thyroid nodule: Secondary | ICD-10-CM

## 2016-01-15 DIAGNOSIS — E890 Postprocedural hypothyroidism: Secondary | ICD-10-CM

## 2016-01-15 LAB — T4, FREE: FREE T4: 1.63 ng/dL — AB (ref 0.60–1.60)

## 2016-01-15 LAB — TSH: TSH: 0.24 u[IU]/mL — AB (ref 0.35–4.50)

## 2016-01-15 NOTE — Progress Notes (Signed)
Pre visit review using our clinic review tool, if applicable. No additional management support is needed unless otherwise documented below in the visit note. 

## 2016-01-15 NOTE — Progress Notes (Signed)
Patient ID: Cheryl Stone, female   DOB: Feb 19, 1966, 50 y.o.   MRN: [id number]            Reason for Appointment:  Hypothyroidism, new visit    History of Present Illness:    Hypothyroidism was first diagnosed in 03/1998  At that time patient was having symptoms of  fatigue, cold sensitivity and weight loss. She was found to be hyperthyroid and she was also having significant difficulty swallowing. She was told that her thyroid was the size of a golf ball and she was having Graves' disease She was given radioactive iodine  Subsequently her labs showed that she was hypothyroid and she was started on thyroid supplements.  At that time she was feeling fairly well. She was followed regularly before she moved here and apparently her levothyroxine dose has been increased stepwise About 2 years ago she was taking 200 g and now she has been taking a stable dose of 112 g, 2 tablets daily .           The patient has been followed locally in 2016 only She has been feeling fairly well with her energy level is usually.  No significant weight change or cold intolerance No dry skin She is wanting more information about hypothyroidism  THYROID nodule: She was sent for her thyroid ultrasound in 2015 and she was not told why. This showed a mostly solid 1.5 cm nodule on the right side.  No further evaluation was done         Patient's weight history is as follows:  Wt Readings from Last 3 Encounters:  01/15/16 309 lb (140.161 kg)  09/02/15 303 lb (137.44 kg)  08/19/15 303 lb 12.8 oz (137.803 kg)    Thyroid function results have been as follows:  Lab Results  Component Value Date   FREET4 1.63* 01/15/2016   TSH 0.24* 01/15/2016   TSH 0.48 08/05/2015   TSH 1.51 06/23/2015     Past Medical History  Diagnosis Date  . Hypothyroidism   . Graves disease     1999 had iodine radiation  . Hypertension   . Fluid retention   . Acid reflux   . Back pain   . Osteoarthritis     Past Surgical  History  Procedure Laterality Date  . Cholecystectomy    . Tubal ligation    . Uterine ablation      Family History  Problem Relation Age of Onset  . Sudden Cardiac Death Mother     Saw her sister get killed, which led to heart attack  . Hypertension Maternal Grandmother   . Alzheimer's disease Maternal Grandfather   . Colon cancer Neg Hx   . Thyroid disease Paternal Grandmother     Social History:  reports that she has never smoked. She has never used smokeless tobacco. She reports that she drinks alcohol. She reports that she does not use illicit drugs.  Allergies:  Allergies  Allergen Reactions  . Lobster [Shellfish Allergy] Rash    Lobster  . Tylenol [Acetaminophen] Hives and Swelling      Medication List       This list is accurate as of: 01/15/16 11:59 PM.  Always use your most recent med list.               amLODipine 5 MG tablet  Commonly known as:  NORVASC  TAKE 1 TABLET (5 MG TOTAL) BY MOUTH DAILY.     aspirin EC 81 MG tablet  Take  162 mg by mouth once.     D-5000 5000 units Tabs  Generic drug:  Cholecalciferol  Take 1 tablet by mouth daily.     ferrous fumarate 325 (106 Fe) MG Tabs tablet  Commonly known as:  HEMOCYTE - 106 mg FE  Take 1 tablet by mouth daily as needed (iron deficiency).     hydrochlorothiazide 25 MG tablet  Commonly known as:  HYDRODIURIL  TAKE 1 TABLET (25 MG TOTAL) BY MOUTH DAILY.     levothyroxine 112 MCG tablet  Commonly known as:  SYNTHROID, LEVOTHROID  TAKE 2 TABLETS (224 MCG TOTAL) BY MOUTH DAILY BEFORE BREAKFAST.     meloxicam 15 MG tablet  Commonly known as:  MOBIC  TAKE 1 TABLET (15 MG TOTAL) BY MOUTH DAILY AS NEEDED FOR PAIN.     phentermine 30 MG capsule  Take 1 capsule (30 mg total) by mouth every morning.          Review of Systems  Constitutional: Positive for weight loss.       She has had difficulty with weight loss.  Her PCP has given her phentermine.  Because of her feeling jittery with this she  does not take this daily.  Has only lost about 7 pounds with this.  Has not had any nutritional consultation  HENT: Negative for headaches.   Eyes:       No prominence of the eyes currently or previously, no redness or irritation  Respiratory: Negative for shortness of breath.   Cardiovascular: Positive for leg swelling. Negative for palpitations.       Mostly on the left  Gastrointestinal: Negative for diarrhea.  Endocrine: Negative for fatigue and cold intolerance.  Musculoskeletal: Positive for joint pain.  Neurological: Negative for numbness.  Psychiatric/Behavioral: Negative for nervousness.                Examination:    BP 120/86 mmHg  Pulse 93  Temp(Src) 98.3 F (36.8 C) (Oral)  Resp 20  Ht 5\' 6"  (1.676 m)  Wt 309 lb (140.161 kg)  BMI 49.90 kg/m2  SpO2 98%  LMP   GENERAL: Generalized obesity present  Average build.   No pallor, clubbing, lymphadenopathy.  Skin:  no rash or pigmentation.  EYES:  No prominence of the eyes or swelling of the eyelids  ENT: Oral mucosa and tongue normal.  THYROID:  Not palpable.  HEART:  Normal  S1 and S2; no murmur or click.  CHEST:    Lungs: Vescicular breath sounds heard equally.  No crepitations/ wheeze.  ABDOMEN:  No distention.  Liver and spleen not palpable.  No other mass or tenderness.  NEUROLOGICAL: Reflexes are bilaterally normal at biceps.  JOINTS:  Normal peripheral joints.  Extremities: 1+ left lower leg edema   Assessment:  HYPOTHYROIDISM, post reactive iodine treatment, reportedly for Graves' disease However not clear if she may have had a toxic nodule and no previous records are available Although she is taking relatively large doses of Synthroid of 224 g this appears appropriate for her weight She is quite compliant with taking the medication before breakfast daily She is clinically doing well with no unusual fatigue or hypothyroid symptoms  1.5 cm dominant nodule in the right lobe demonstrated in  2015.  Not clear this is a remnant of a previous nodule or a new nodule  PLAN:   Check thyroid levels and decide on dosage  Repeat thyroid ultrasound to evaluate thyroid nodule.  If the nodule is unchanged smaller  may not need any further ultrasound exams  Follow-up to be decided on results of her labs and ultrasound   [facility] 01/16/2016, 7:07 PM   Addendum: Thyroid levels as follows.  Will reduce her medication dose by 1 tablet weekly  Lab Results  Component Value Date   TSH 0.24* 01/15/2016

## 2016-01-16 ENCOUNTER — Encounter: Payer: Self-pay | Admitting: Endocrinology

## 2016-01-16 NOTE — Progress Notes (Signed)
Quick Note:  Please let patient know that the thyroid level is a little high Will need to have her take 2 tablets on 6 days and only one tablet on the seventh day Change follow-up visit to 3 months instead of 6 months ______

## 2016-01-18 ENCOUNTER — Other Ambulatory Visit: Payer: Self-pay

## 2016-01-18 DIAGNOSIS — Z1231 Encounter for screening mammogram for malignant neoplasm of breast: Secondary | ICD-10-CM

## 2016-01-19 ENCOUNTER — Other Ambulatory Visit: Payer: Self-pay | Admitting: *Deleted

## 2016-01-19 MED ORDER — LEVOTHYROXINE SODIUM 112 MCG PO TABS: ORAL_TABLET | ORAL | Status: DC

## 2016-01-20 ENCOUNTER — Ambulatory Visit (INDEPENDENT_AMBULATORY_CARE_PROVIDER_SITE_OTHER): Payer: BLUE CROSS/BLUE SHIELD | Admitting: Adult Health

## 2016-01-20 ENCOUNTER — Encounter: Payer: Self-pay | Admitting: Adult Health

## 2016-01-20 VITALS — BP 110/80 | HR 109 | Temp 98.7°F | Wt 312.1 lb

## 2016-01-20 DIAGNOSIS — M25562 Pain in left knee: Secondary | ICD-10-CM

## 2016-01-20 MED ORDER — METHYLPREDNISOLONE ACETATE 80 MG/ML IJ SUSP
120.0000 mg | Freq: Once | INTRAMUSCULAR | Status: AC
  Administered 2016-01-20: 120 mg via INTRA_ARTICULAR

## 2016-01-20 NOTE — Progress Notes (Signed)
Pre visit review using our clinic review tool, if applicable. No additional management support is needed unless otherwise documented below in the visit note. 

## 2016-01-20 NOTE — Patient Instructions (Signed)
It was great seeing you again!  Please let me know if this injection does not work. Follow up with any signs of infection.   Review the information on [facility] and let me know if this is something you are interested in  Continue to work hard! I promise you we will get through this.   Someone will call you to schedule your exam with the Bariatric Nurse

## 2016-01-20 NOTE — Progress Notes (Signed)
   Subjective:    Patient ID: Cheryl Stone, female    DOB: 1966/09/15, 50 y.o.   MRN: [id number]  HPI  50 year old female who comes into the office today for follow up regarding left knee pain. This has been an ongoing issue for the patient. We have tried Mobic with limited success.   She is obese and we had placed her on phentermine. She has not been taking this  Medication due to the feeling of having her heart race. Her current weight is up to 312lbs. She is trying to exercise ( walk) but is finding it more difficult due to knee pain. She is frustrated and tearful. She would like to see someone about having weight loss surgery.   Review of Systems  Constitutional: Positive for activity change.  Respiratory: Negative.   Cardiovascular: Negative.   Musculoskeletal: Positive for back pain and arthralgias. Negative for joint swelling and gait problem.  Skin: Negative.   All other systems reviewed and are negative.      Objective:   Physical Exam  Constitutional: She is oriented to person, place, and time. She appears well-developed and well-nourished. No distress.  obese  Cardiovascular: Normal rate, regular rhythm, normal heart sounds and intact distal pulses.  Exam reveals no gallop and no friction rub.   No murmur heard. Pulmonary/Chest: Effort normal and breath sounds normal. No respiratory distress. She has no wheezes. She has no rales. She exhibits no tenderness.  Musculoskeletal: She exhibits edema and tenderness.       Left knee: She exhibits decreased range of motion and swelling. She exhibits normal meniscus. Tenderness found. No medial joint line, no lateral joint line, no MCL, no LCL and no patellar tendon tenderness noted.  Neurological: She is alert and oriented to person, place, and time.  Skin: Skin is warm and dry. No rash noted. She is not diaphoretic. No erythema. No pallor.  Psychiatric: She has a normal mood and affect. Her behavior is normal. Judgment and thought  content normal.  Nursing note and vitals reviewed.     Assessment & Plan:  1. Left knee pain methylPREDNISolone acetate (DEPO-MEDROL) injection 120 mg;  Consent obtained and verified. Sterile betadine prep. Furthur cleansed with alcohol. Topical analgesic spray: Ethyl chloride. Joint: Left knee Approached in typical fashion with Anterolateral approach Completed without difficulty Meds: 120 MG Depo Medrol & 2% Lidocaine  Needle:22 GF 1.5 in Aftercare instructions and Red flags advised.  - methylPREDNISolone acetate (DEPO-MEDROL) injection 120 mg; Inject 1.5 mLs (120 mg total) into the articular space once.   2. Morbid obesity due to excess calories ([place]) - Consult to bariatric RN

## 2016-01-21 ENCOUNTER — Other Ambulatory Visit: Payer: BLUE CROSS/BLUE SHIELD

## 2016-01-21 ENCOUNTER — Ambulatory Visit: Payer: No Typology Code available for payment source | Admitting: Adult Health

## 2016-01-27 ENCOUNTER — Other Ambulatory Visit: Payer: Self-pay | Admitting: Adult Health

## 2016-01-27 ENCOUNTER — Encounter: Payer: Self-pay | Admitting: Adult Health

## 2016-01-27 MED ORDER — LORCASERIN HCL 10 MG PO TABS: 10.0000 mg | ORAL_TABLET | Freq: Two times a day (BID) | ORAL | Status: DC

## 2016-01-28 ENCOUNTER — Ambulatory Visit
Admission: RE | Admit: 2016-01-28 | Discharge: 2016-01-28 | Disposition: A | Discharge status: Home / Self Care | Payer: BLUE CROSS/BLUE SHIELD | Source: Ambulatory Visit | Attending: Endocrinology | Admitting: Endocrinology

## 2016-01-28 DIAGNOSIS — E041 Nontoxic single thyroid nodule: Secondary | ICD-10-CM

## 2016-01-28 DIAGNOSIS — E890 Postprocedural hypothyroidism: Secondary | ICD-10-CM

## 2016-01-29 ENCOUNTER — Other Ambulatory Visit: Payer: Self-pay | Admitting: Endocrinology

## 2016-01-29 DIAGNOSIS — E041 Nontoxic single thyroid nodule: Secondary | ICD-10-CM

## 2016-02-02 ENCOUNTER — Ambulatory Visit
Admission: RE | Admit: 2016-02-02 | Discharge: 2016-02-02 | Disposition: A | Discharge status: Home / Self Care | Payer: BLUE CROSS/BLUE SHIELD | Source: Ambulatory Visit

## 2016-02-02 DIAGNOSIS — Z1231 Encounter for screening mammogram for malignant neoplasm of breast: Secondary | ICD-10-CM

## 2016-02-09 ENCOUNTER — Ambulatory Visit
Admission: RE | Admit: 2016-02-09 | Discharge: 2016-02-09 | Disposition: A | Discharge status: Home / Self Care | Payer: BLUE CROSS/BLUE SHIELD | Source: Ambulatory Visit | Attending: Endocrinology | Admitting: Endocrinology

## 2016-02-09 ENCOUNTER — Other Ambulatory Visit (HOSPITAL_COMMUNITY)
Admission: RE | Admit: 2016-02-09 | Discharge: 2016-02-09 | Disposition: A | Discharge status: Home / Self Care | Payer: BLUE CROSS/BLUE SHIELD | Source: Ambulatory Visit | Attending: Interventional Radiology | Admitting: Interventional Radiology

## 2016-02-09 DIAGNOSIS — E041 Nontoxic single thyroid nodule: Secondary | ICD-10-CM

## 2016-02-11 ENCOUNTER — Encounter: Payer: Self-pay | Admitting: Adult Health

## 2016-02-11 NOTE — Progress Notes (Signed)
Quick Note:  Please let patient know that the biopsy is benign, follow-up in May  ______

## 2016-02-18 ENCOUNTER — Emergency Department (HOSPITAL_COMMUNITY)
Admission: EM | Admit: 2016-02-18 | Discharge: 2016-02-18 | Disposition: A | Discharge status: Home / Self Care | Payer: BLUE CROSS/BLUE SHIELD | Attending: Emergency Medicine | Admitting: Emergency Medicine

## 2016-02-18 ENCOUNTER — Emergency Department (HOSPITAL_BASED_OUTPATIENT_CLINIC_OR_DEPARTMENT_OTHER)
Admit: 2016-02-18 | Discharge: 2016-02-18 | Disposition: A | Discharge status: Home / Self Care | Payer: BLUE CROSS/BLUE SHIELD

## 2016-02-18 ENCOUNTER — Encounter (HOSPITAL_COMMUNITY): Payer: Self-pay | Admitting: Emergency Medicine

## 2016-02-18 DIAGNOSIS — Z8719 Personal history of other diseases of the digestive system: Secondary | ICD-10-CM | POA: Diagnosis not present

## 2016-02-18 DIAGNOSIS — M1990 Unspecified osteoarthritis, unspecified site: Secondary | ICD-10-CM | POA: Insufficient documentation

## 2016-02-18 DIAGNOSIS — E669 Obesity, unspecified: Secondary | ICD-10-CM | POA: Insufficient documentation

## 2016-02-18 DIAGNOSIS — Z79899 Other long term (current) drug therapy: Secondary | ICD-10-CM | POA: Diagnosis not present

## 2016-02-18 DIAGNOSIS — Z7982 Long term (current) use of aspirin: Secondary | ICD-10-CM | POA: Diagnosis not present

## 2016-02-18 DIAGNOSIS — E039 Hypothyroidism, unspecified: Secondary | ICD-10-CM | POA: Insufficient documentation

## 2016-02-18 DIAGNOSIS — M79609 Pain in unspecified limb: Secondary | ICD-10-CM

## 2016-02-18 DIAGNOSIS — M79652 Pain in left thigh: Secondary | ICD-10-CM | POA: Diagnosis present

## 2016-02-18 DIAGNOSIS — I10 Essential (primary) hypertension: Secondary | ICD-10-CM | POA: Insufficient documentation

## 2016-02-18 LAB — I-STAT CHEM 8, ED
BUN: 12 mg/dL (ref 6–20)
CHLORIDE: 102 mmol/L (ref 101–111)
Calcium, Ion: 1.14 mmol/L (ref 1.12–1.23)
Creatinine, Ser: 0.7 mg/dL (ref 0.44–1.00)
Glucose, Bld: 92 mg/dL (ref 65–99)
HEMATOCRIT: 48 % — AB (ref 36.0–46.0)
Hemoglobin: 16.3 g/dL — ABNORMAL HIGH (ref 12.0–15.0)
POTASSIUM: 3.5 mmol/L (ref 3.5–5.1)
SODIUM: 139 mmol/L (ref 135–145)
TCO2: 25 mmol/L (ref 0–100)

## 2016-02-18 MED ORDER — IBUPROFEN 800 MG PO TABS
800.0000 mg | ORAL_TABLET | Freq: Once | ORAL | Status: AC
  Administered 2016-02-18: 800 mg via ORAL
  Filled 2016-02-18: qty 1

## 2016-02-18 MED ORDER — METHOCARBAMOL 500 MG PO TABS: 500.0000 mg | ORAL_TABLET | Freq: Two times a day (BID) | ORAL | Status: DC

## 2016-02-18 MED ORDER — IBUPROFEN 800 MG PO TABS: 800.0000 mg | ORAL_TABLET | Freq: Three times a day (TID) | ORAL | Status: DC

## 2016-02-18 MED ORDER — CYCLOBENZAPRINE HCL 10 MG PO TABS
5.0000 mg | ORAL_TABLET | Freq: Once | ORAL | Status: AC
  Administered 2016-02-18: 5 mg via ORAL
  Filled 2016-02-18: qty 1

## 2016-02-18 NOTE — ED Provider Notes (Signed)
CSN: [id number]     Arrival date & time 02/18/16  1115 History   First MD Initiated Contact with Patient 02/18/16 1129     Chief Complaint  Patient presents with  . Leg Pain     (Consider location/radiation/quality/duration/timing/severity/associated sxs/prior Treatment) HPI   50 year old obese female with history of back pain, osteoarthritis, thyroid disease and hypertension presenting the EMS with complaints of left leg pain. Patient report atraumatic left thigh cramping while at work. Incident happened approximately one hour ago while she was sitting. She report acute onset of sharp shooting crampy upper left medial thigh that has been persistent without any improvement despite trying to walk and stretching. She tried using ice without adequate relief. She denies any fall injury or any recent strenuous activities. She denies having any fever, or rash. She reported having recurrent low back pain but no worsening pain. She has several bouts of diarrhea yesterday but no vomiting or abdominal pain. She denies any prior history of PE or DVT, did had a thyroid biopsy a week ago but no major surgery. She is not on any oral hormone and no prolonged bed rest. No history of gout.  Past Medical History  Diagnosis Date  . Hypothyroidism   . Graves disease     1999 had iodine radiation  . Hypertension   . Fluid retention   . Acid reflux   . Back pain   . Osteoarthritis    Past Surgical History  Procedure Laterality Date  . Cholecystectomy    . Tubal ligation    . Uterine ablation     Family History  Problem Relation Age of Onset  . Sudden Cardiac Death Mother     Saw her sister get killed, which led to heart attack  . Hypertension Maternal Grandmother   . Alzheimer's disease Maternal Grandfather   . Colon cancer Neg Hx   . Thyroid disease Paternal Grandmother    Social History  Substance Use Topics  . Smoking status: Never Smoker   . Smokeless tobacco: Never Used  . Alcohol Use: 0.0  oz/week    0 Standard drinks or equivalent per week     Comment: once every 2 months   OB History    No data available     Review of Systems  Constitutional: Negative for fever.  Respiratory: Negative for chest tightness and shortness of breath.   Musculoskeletal: Positive for myalgias and arthralgias. Negative for joint swelling.  Skin: Negative for rash and wound.  Neurological: Negative for numbness.  All other systems reviewed and are negative.     Allergies  Lobster and Tylenol  Home Medications   Prior to Admission medications   Medication Sig Start Date End Date Taking? Authorizing Provider  amLODipine (NORVASC) 5 MG tablet TAKE 1 TABLET (5 MG TOTAL) BY MOUTH DAILY. 01/09/16   Dorothyann Peng, NP  aspirin EC 81 MG tablet Take 162 mg by mouth once.    Historical Provider, MD  Cholecalciferol (D-5000) 5000 UNITS TABS Take 1 tablet by mouth daily. 05/25/15   Historical Provider, MD  ferrous fumarate (HEMOCYTE - 106 MG FE) 325 (106 FE) MG TABS tablet Take 1 tablet by mouth daily as needed (iron deficiency).    Historical Provider, MD  hydrochlorothiazide (HYDRODIURIL) 25 MG tablet TAKE 1 TABLET (25 MG TOTAL) BY MOUTH DAILY. 01/09/16   Dorothyann Peng, NP  levothyroxine (SYNTHROID, LEVOTHROID) 112 MCG tablet TAKE 2 TABLETS (224 MCG TOTAL) BY MOUTH DAILY BEFORE BREAKFAST FOR 6 DAYS A WEEK,  AND THEN 1 TABLET ON THE 7TH DAY. 01/19/16   Elayne Snare, MD  Lorcaserin HCl 10 MG TABS Take 10 mg by mouth 2 (two) times daily. 01/27/16   Dorothyann Peng, NP  meloxicam (MOBIC) 15 MG tablet TAKE 1 TABLET (15 MG TOTAL) BY MOUTH DAILY AS NEEDED FOR PAIN. Patient not taking: Reported on 01/20/2016 01/13/16   Dorothyann Peng, NP   BP 117/79 mmHg  Pulse 72  Temp(Src) 97.6 F (36.4 C) (Oral)  Resp 18  Ht 5\' 6"  (1.676 m)  Wt 137.893 kg  BMI 49.09 kg/m2  SpO2 94% Physical Exam  Constitutional: She appears well-developed and well-nourished. No distress.  Moderately obese African-American female appears  uncomfortable but nontoxic.  HENT:  Head: Atraumatic.  Eyes: Conjunctivae are normal.  Neck: Neck supple.  Cardiovascular: Normal rate, regular rhythm and intact distal pulses.   Pulmonary/Chest: Effort normal and breath sounds normal.  Abdominal: Soft. There is no tenderness.  Musculoskeletal: She exhibits tenderness (Left leg: Tenderness to medial anterior thigh on palpation without any overlying skin changes. Normal left hip range of motion and normal left knee range of motion. Dorsalis pedis pulse palpable. Difficult to appreciate edema secondary to large body habitu).  Neurological: She is alert.  Skin: No rash noted.  Psychiatric: She has a normal mood and affect.  Nursing note and vitals reviewed.   ED Course  Procedures (including critical care time) Labs Review Labs Reviewed  I-STAT CHEM 8, ED - Abnormal; Notable for the following:    Hemoglobin 16.3 (*)    HCT 48.0 (*)    All other components within normal limits    Imaging Review No results found. I have personally reviewed and evaluated these images and lab results as part of my medical decision-making.   EKG Interpretation None      MDM   Final diagnoses:  Thigh pain, musculoskeletal, left    BP 117/79 mmHg  Pulse 72  Temp(Src) 97.6 F (36.4 C) (Oral)  Resp 18  Ht 5\' 6"  (1.676 m)  Wt 137.893 kg  BMI 49.09 kg/m2  SpO2 94%  LMP 02/11/2016   11:50 AM Patient presents with atraumatic left thigh pain. Suspect musculoskeletal as the cause.  She reported having several bouts of diarrhea yesterday. I will check electrolytes. A Doppler study ordered to rule out DVT. X-rays not indicated.  12:41 PM Patient has normal electrolytes. DVT study was negative for blood clots. The skin exam is unremarkable. She is neurovascularly intact. No red flags.  Pt able to ambulate and her sxs improves with treatment.  Ortho referral given as needed.  Return precaution discussed.   Domenic Moras, PA-C 02/18/16 [place], MD 02/19/16 0730

## 2016-02-18 NOTE — ED Notes (Signed)
Per EMS, patient had left thigh cramp while at work.  Denies any fall or injury.    BP: 140 palpated HR:90  CBG:102

## 2016-02-18 NOTE — Discharge Instructions (Signed)

## 2016-02-18 NOTE — Progress Notes (Addendum)
.*  Preliminary Results* Left lower extremity venous duplex completed. Study was technically difficult and limited due to patient body habitus. Visualized veins of the left lower extremity are negative for deep vein thrombosis. There is no evidence of left Baker's cyst.  02/18/2016 12:48 PM  Maudry Mayhew, RVT, RDCS, RDMS

## 2016-02-25 ENCOUNTER — Encounter: Payer: Self-pay | Admitting: Adult Health

## 2016-03-08 ENCOUNTER — Encounter: Payer: Self-pay | Admitting: Adult Health

## 2016-03-22 ENCOUNTER — Ambulatory Visit (INDEPENDENT_AMBULATORY_CARE_PROVIDER_SITE_OTHER): Payer: BLUE CROSS/BLUE SHIELD | Admitting: Adult Health

## 2016-03-22 ENCOUNTER — Encounter: Payer: Self-pay | Admitting: Adult Health

## 2016-03-22 VITALS — BP 122/80 | Temp 98.2°F | Wt 306.3 lb

## 2016-03-22 DIAGNOSIS — M25561 Pain in right knee: Secondary | ICD-10-CM

## 2016-03-22 MED ORDER — METHYLPREDNISOLONE ACETATE 80 MG/ML IJ SUSP
120.0000 mg | Freq: Once | INTRAMUSCULAR | Status: AC
  Administered 2016-03-22: 120 mg via INTRA_ARTICULAR

## 2016-03-22 NOTE — Patient Instructions (Signed)
It was great seeing you again!  That injection should take affect in 24-36 hours.   Follow up if no improvement.

## 2016-03-22 NOTE — Progress Notes (Signed)
Subjective:    Patient ID: Cheryl Stone, female    DOB: 1965-12-15, 50 y.o.   MRN: [id number]  HPI  50 year old female who comes to the office today for right knee pain, this has been an ongoing issue for the patient. In February I did a cortisone injection into the left knee, she reports that much of the pain has diminished in her left knee.   She has a sharp pain that feels like it is coming " from the inside". The pain is worse with movement and walking. She has been applying Icy hot at home, which has not been helping. She denies any trauma to the area.   She is meeting with [place] next week to discuss her options. She is very excited about this.   Review of Systems  Constitutional: Negative.   Musculoskeletal: Positive for myalgias, arthralgias and gait problem. Negative for joint swelling.  Skin: Negative.   All other systems reviewed and are negative.  Past Medical History  Diagnosis Date  . Hypothyroidism   . Graves disease     1999 had iodine radiation  . Hypertension   . Fluid retention   . Acid reflux   . Back pain   . Osteoarthritis   . Morbid obesity ([place])     Social History   Social History  . Marital Status: Single    Spouse Name: N/A  . Number of Children: N/A  . Years of Education: N/A   Occupational History  . Not on file.   Social History Main Topics  . Smoking status: Never Smoker   . Smokeless tobacco: Never Used  . Alcohol Use: 0.0 oz/week    0 Standard drinks or equivalent per week     Comment: once every 2 months  . Drug Use: No  . Sexual Activity: Not on file   Other Topics Concern  . 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        Past Surgical History  Procedure Laterality Date  . Cholecystectomy    . Tubal ligation    . Uterine ablation      Family History  Problem Relation Age of Onset  . Sudden  Cardiac Death Mother     Saw her sister get killed, which led to heart attack  . Hypertension Maternal Grandmother   . Alzheimer's disease Maternal Grandfather   . Colon cancer Neg Hx   . Thyroid disease Paternal Grandmother     Allergies  Allergen Reactions  . Lobster [Shellfish Allergy] Rash    Lobster  . Tylenol [Acetaminophen] Hives and Swelling    Current Outpatient Prescriptions on File Prior to Visit  Medication Sig Dispense Refill  . amLODipine (NORVASC) 5 MG tablet TAKE 1 TABLET (5 MG TOTAL) BY MOUTH DAILY. 90 tablet 1  . aspirin EC 81 MG tablet Take 162 mg by mouth once.    . ferrous fumarate (HEMOCYTE - 106 MG FE) 325 (106 FE) MG TABS tablet Take 1 tablet by mouth daily as needed (iron deficiency).    . hydrochlorothiazide (HYDRODIURIL) 25 MG tablet TAKE 1 TABLET (25 MG TOTAL) BY MOUTH DAILY. 90 tablet 1  . ibuprofen (ADVIL,MOTRIN) 800 MG tablet Take 1 tablet (800 mg total) by mouth 3 (three) times daily. 21 tablet 0  . levothyroxine (SYNTHROID, LEVOTHROID) 112 MCG tablet TAKE 2  TABLETS (224 MCG TOTAL) BY MOUTH DAILY BEFORE BREAKFAST FOR 6 DAYS A WEEK, AND THEN 1 TABLET ON THE 7TH DAY. 90 tablet 1  . Lorcaserin HCl 10 MG TABS Take 10 mg by mouth 2 (two) times daily. 60 tablet 0  . methocarbamol (ROBAXIN) 500 MG tablet Take 1 tablet (500 mg total) by mouth 2 (two) times daily. 20 tablet 0  . Cholecalciferol (D-5000) 5000 UNITS TABS Take 1 tablet by mouth daily. Reported on 03/22/2016     No current facility-administered medications on file prior to visit.    BP 122/80 mmHg  Temp(Src) 98.2 F (36.8 C) (Oral)  Wt 306 lb 4.8 oz (138.937 kg)       Objective:   Physical Exam Constitutional: Positive for activity change.  Respiratory: Negative.  Cardiovascular: Negative.  Musculoskeletal: Positive for back pain and arthralgias. Negative for joint swelling and gait problem.  Skin: Negative.  All other systems reviewed and are negative.     Assessment & Plan:  1.  Right knee pain - methylPREDNISolone acetate (DEPO-MEDROL) injection 120 mg; Inject 1.5 mLs (120 mg total) into the articular space once.  Consent obtained and verified. Sterile betadine prep. Furthur cleansed with alcohol. Topical analgesic spray: Ethyl chloride. Joint: Left knee Approached in typical fashion with Anterolateral approach Completed without difficulty Meds: 120 MG Depo Medrol & 2% Lidocaine  Needle:22 GF 1.5 in Aftercare instructions and Red flags advised.  Dorothyann Peng, NP

## 2016-03-22 NOTE — Progress Notes (Signed)
Pre visit review using our clinic review tool, if applicable. No additional management support is needed unless otherwise documented below in the visit note. 

## 2016-03-22 NOTE — Addendum Note (Signed)
Addended by: Sandria Bales B on: 03/22/2016 05:01 PM   Modules accepted: Miquel Dunn

## 2016-03-25 ENCOUNTER — Encounter: Payer: Self-pay | Admitting: Adult Health

## 2016-03-28 ENCOUNTER — Encounter: Payer: Self-pay | Admitting: Family Medicine

## 2016-03-28 ENCOUNTER — Ambulatory Visit (INDEPENDENT_AMBULATORY_CARE_PROVIDER_SITE_OTHER): Payer: BLUE CROSS/BLUE SHIELD | Admitting: Family Medicine

## 2016-03-28 VITALS — BP 118/70 | HR 80 | Temp 98.1°F | Ht 66.0 in | Wt 290.2 lb

## 2016-03-28 DIAGNOSIS — M5410 Radiculopathy, site unspecified: Secondary | ICD-10-CM | POA: Diagnosis not present

## 2016-03-28 DIAGNOSIS — G8929 Other chronic pain: Secondary | ICD-10-CM

## 2016-03-28 DIAGNOSIS — M549 Dorsalgia, unspecified: Secondary | ICD-10-CM | POA: Diagnosis not present

## 2016-03-28 DIAGNOSIS — M62838 Other muscle spasm: Secondary | ICD-10-CM | POA: Diagnosis not present

## 2016-03-28 DIAGNOSIS — Z6841 Body Mass Index (BMI) 40.0 and over, adult: Secondary | ICD-10-CM | POA: Diagnosis not present

## 2016-03-28 MED ORDER — METHOCARBAMOL 500 MG PO TABS: 500.0000 mg | ORAL_TABLET | Freq: Three times a day (TID) | ORAL | Status: AC | PRN

## 2016-03-28 MED ORDER — TRAMADOL HCL 50 MG PO TABS: 50.0000 mg | ORAL_TABLET | Freq: Three times a day (TID) | ORAL | Status: AC | PRN

## 2016-03-28 MED ORDER — CYCLOBENZAPRINE HCL 10 MG PO TABS: 10.0000 mg | ORAL_TABLET | Freq: Every evening | ORAL | Status: DC | PRN

## 2016-03-28 MED ORDER — GABAPENTIN 300 MG PO CAPS: 300.0000 mg | ORAL_CAPSULE | Freq: Two times a day (BID) | ORAL | Status: DC

## 2016-03-28 NOTE — Progress Notes (Addendum)
Subjective:    Patient ID: Cheryl Stone, female    DOB: [birth year], 50 y.o.   MRN: [id number]  HPI  [name] is a 50 y.o.female here today complaining of 4 days of worsening left lower back pain, radiated to LLE, and associated with numbness sensation on anterior aspect of thigh and distal leg. Numbness is not new.  She denies any recent injury or unusual level of activity. Pain is intermittent, described as achy/sharp like, "10+/10" in intensity, with no associated LE weakness, tingling, urinary incontinence or retention, stool incontinence, or saddle anesthesia. No rash or edema on area, fever, or chills. She has prior hx of back pain, herniated discs, 4-5 years, reporting lumbar MRI done in 2012. She has done PT in the past.  + Cold sensation LLE also for years.She reports having U/S on LLE to evaluate for "blood clots" and reported as negative.  She has not noted tachycardia, dyspnea, chest pain, LE erythema or worsening edema.  Pain is exacerbated by movement and alleviated by rest and stretching + local heat/ice.  She has tried OTC Aleve 220 mg took 3 tabs today at 9:30 am. She has been on Ibuprofen 800 mg tid as needed, took it today at 8 am. It causes "irritation" of her stomach. She also has Hx of knee pain and has had intra articular injections in right knee before. She is also c/o muscle spasm, mainly at night, LLE, and for a while. She usually applies local heat, which helps some.   Review of Systems  Constitutional: Negative for fever, activity change, appetite change, fatigue and unexpected weight change.  HENT: Negative for mouth sores, sore throat and trouble swallowing.   Respiratory: Negative for cough, shortness of breath and wheezing.   Cardiovascular: Positive for leg swelling (at baseline). Negative for chest pain and palpitations.       Cold sensation LLE   Gastrointestinal: Negative for nausea, vomiting, abdominal pain and blood in stool.   Negative for changes in bowel habits or fecal incontinence.  Genitourinary: Negative for dysuria, hematuria and decreased urine volume.       Negative for urine incontinence.  Musculoskeletal: Positive for myalgias (LLE muscle spasms), back pain and arthralgias. Negative for joint swelling and neck pain.  Skin: Negative for color change and rash.  Neurological: Positive for numbness. Negative for tremors, speech difficulty, weakness and headaches.  Hematological: Negative for adenopathy. Does not bruise/bleed easily.  Psychiatric/Behavioral: Negative for confusion. The patient is not nervous/anxious.      Current Outpatient Prescriptions on File Prior to Visit  Medication Sig Dispense Refill  . amLODipine (NORVASC) 5 MG tablet TAKE 1 TABLET (5 MG TOTAL) BY MOUTH DAILY. 90 tablet 1  . aspirin EC 81 MG tablet Take 162 mg by mouth once.    . Cholecalciferol (D-5000) 5000 UNITS TABS Take 1 tablet by mouth daily. Reported on 03/22/2016    . ferrous fumarate (HEMOCYTE - 106 MG FE) 325 (106 FE) MG TABS tablet Take 1 tablet by mouth daily as needed (iron deficiency).    . hydrochlorothiazide (HYDRODIURIL) 25 MG tablet TAKE 1 TABLET (25 MG TOTAL) BY MOUTH DAILY. 90 tablet 1  . ibuprofen (ADVIL,MOTRIN) 800 MG tablet Take 1 tablet (800 mg total) by mouth 3 (three) times daily. 21 tablet 0  . levothyroxine (SYNTHROID, LEVOTHROID) 112 MCG tablet TAKE 2 TABLETS (224 MCG TOTAL) BY MOUTH DAILY BEFORE BREAKFAST FOR 6 DAYS A WEEK, AND THEN 1 TABLET ON THE 7TH DAY. 90 tablet  1  . Lorcaserin HCl 10 MG TABS Take 10 mg by mouth 2 (two) times daily. 60 tablet 0   No current facility-administered medications on file prior to visit.     Past Medical History  Diagnosis Date  . Hypothyroidism   . Graves disease     1999 had iodine radiation  . Hypertension   . Fluid retention   . Acid reflux   . Back pain   . Osteoarthritis   . Morbid obesity ([place])     Social History   Social History  . Marital Status:  Single    Spouse Name: N/A  . Number of Children: N/A  . Years of Education: N/A   Social History Main Topics  . Smoking status: Never Smoker   . Smokeless tobacco: Never Used  . Alcohol Use: 0.0 oz/week    0 Standard drinks or equivalent per week     Comment: once every 2 months  . Drug Use: No  . Sexual Activity: Not Asked   Other Topics Concern  . 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        Filed Vitals:   03/28/16 1329  BP: 118/70  Pulse: 80  Temp: 98.1 F (36.7 C)   Body mass index is 46.86 kg/(m^2).      Objective:   Physical Exam  Constitutional: She is oriented to person, place, and time. She appears well-developed. She does not appear ill. She appears distressed (mild due to pain).  HENT:  Head: Atraumatic.  Mouth/Throat: Oropharynx is clear and moist and mucous membranes are normal. No oral lesions.  Eyes: Conjunctivae are normal.  Neck: Normal range of motion.  Cardiovascular: Normal rate, regular rhythm and normal heart sounds.   No murmur heard. Pulses:      Dorsalis pedis pulses are 2+ on the right side, and 2+ on the left side.       Posterior tibial pulses are 2+ on the right side, and 2+ on the left side.  Pulmonary/Chest: Effort normal and breath sounds normal. She has no wheezes. She has no rhonchi. She has no rales. She exhibits no tenderness.  Abdominal: Soft. She exhibits no mass. There is no tenderness.  Musculoskeletal: She exhibits no edema.       Cervical back: She exhibits tenderness and spasm. She exhibits normal range of motion.       Thoracic back: She exhibits no tenderness.       Lumbar back: She exhibits decreased range of motion (flexion), tenderness, pain and spasm. She exhibits no edema.       Back:       Right lower leg: She exhibits no tenderness.       Left lower leg: She exhibits no tenderness.  Tenderness upon palpation of  left trapezium muscle, left interscapular, thoracic muscles and lumbar paraspinal muscles.No tenderness of bony structures/vertebral bodies.  Lymphadenopathy:    She has no cervical adenopathy.  Neurological: She is alert and oriented to person, place, and time. She has normal strength. No sensory deficit. Coordination normal.  SLR negative bilateral. L4,L5,S1 grossly intact. Antalgic gait, stable with no assistance.  Skin: Skin is warm. No lesion and no rash noted.  Psychiatric: She has a normal mood and affect. Her speech is normal.  Well groomed, good eye contact.       Assessment & Plan:  Cheryl Stone was seen today for back pain.  Diagnoses and all orders for this visit:  Exacerbation of chronic back pain Acute on chronic pain. Tramadol for 5-7 days. Caution with NSAID's given her hx of HTN. Muscle relaxant may help some. Wt loss recommended. PT exercises , she remember some she did before, as tolerated. Some side effects of medications discussed, she may need to take meds at bed time. Local heat, stretching, and changes in position during the day (from standing to sitting and vise versa) could help. Cymbalta may be considered in the future for back pain. She agrees with holding on referral for now. If pain is not any better in 6 weeks she may need ortho/neurosurgeon evaluation; before if it gets worse. Instructed about warning signs.   -     traMADol (ULTRAM) 50 MG tablet; Take 1 tablet (50 mg total) by mouth every 8 (eight) hours as needed for moderate pain or severe pain.  -     methocarbamol (ROBAXIN) 500 MG tablet; Take 1 tablet (500 mg total) by mouth every 8 (eight) hours as needed for muscle spasms.   BMI 45.0-49.9, adult (HCC) Regular physical activity: low impact exercise given her chronic back pain, pool exercises. Consistency with healthy diet and regular exercise.   Radicular pain of left lower extremity Chronic. She has taken Gabapentin before for facial  neuropathic pain and reporting good tolerance. Some side effects discussed. She can start medication at bedtime and increase to bid as tolerated. Consider titration  up to 600 mg tid monitoring tolerance. F/U in 4-6 weeks. Clearly instructed about warning signs.  -     gabapentin (NEURONTIN) 300 MG capsule; Take 1 capsule (300 mg total) by mouth 2 (two) times daily.  Muscle spasm of left lower extremity Gabapentin could also help with this problem. Stretching exercises. Today I did not order labs, she had BMP in 02/2016. F/U in 4-6 weeks.  -     gabapentin (NEURONTIN) 300 MG capsule; Take 1 capsule (300 mg total) by mouth 2 (two) times daily.      -Patient advised to return or notify a doctor immediately if symptoms worsen or persist or new concerns arise.  Betty G. Martinique, MD  [facility]. [place] office.

## 2016-03-28 NOTE — Patient Instructions (Signed)
A few things to remember from today's visit:   1. Exacerbation of chronic back pain  - traMADol (ULTRAM) 50 MG tablet; Take 1 tablet (50 mg total) by mouth every 8 (eight) hours as needed for moderate pain or severe pain.  Dispense: 30 tablet; Refill: 0 - cyclobenzaprine (FLEXERIL) 10 MG tablet; Take 1 tablet (10 mg total) by mouth at bedtime as needed for muscle spasms.  Dispense: 30 tablet; Refill: 0  2. BMI 45.0-49.9, adult (HCC)   3. Radicular pain of left lower extremity  - gabapentin (NEURONTIN) 300 MG capsule; Take 1 capsule (300 mg total) by mouth 2 (two) times daily.  Dispense: 60 capsule; Refill: 1  4. Muscle spasm of left lower extremity  - gabapentin (NEURONTIN) 300 MG capsule; Take 1 capsule (300 mg total) by mouth 2 (two) times daily.  Dispense: 60 capsule; Refill: 1       Back pain is very common in adults.The cause of back pain is rarely dangerous and the pain often gets better over time.The cause of your back pain may not be known. Some common causes of back pain include: 1. Strain of the muscles or ligaments supporting the spine. 2. Wear and tear (degeneration) of the spinal disks. 3. Arthritis. 4. Direct injury to the back. For many people, back pain may return. Since back pain is rarely dangerous, most people can learn to manage this condition on their own.  HOME CARE INSTRUCTIONS Watch your back pain for any changes. The following actions may help to lessen any discomfort you are feeling: 1. Remain active. It is stressful on your back to sit or stand in one place for long periods of time. Do not sit, drive, or stand in one place for more than 30 minutes at a time. Take short walks on even surfaces as soon as you are able.Try to increase the length of time you walk each day. 2. Exercise regularly as directed by your health care provider. Exercise helps your back heal faster. It also helps avoid future injury by keeping your muscles strong and  flexible.  3. Do not stay in bed.Resting more than 1-2 days can delay your recovery. 4. Pay attention to your body when you bend and lift. The most comfortable positions are those that put less stress on your recovering back. Always use proper lifting techniques, including: 1. Bending your knees. 2. Keeping the load close to your body. 3. Avoiding twisting.  5. Find a comfortable position to sleep. Use a firm mattress and lie on your side with your knees slightly bent. If you lie on your back, put a pillow under your knees.  6. Over the counter rubbing medications like Icy Hot or local heat might help.  Acetaminophen and/or Aleve/Ibuprofen can be taken if needed and if not contraindications.  Muscle relaxants might or might not help, they cause drowsiness among other side effects. They could also interact with some of medications you may be taking.  7. Apply ice to the injured area: 1. Put ice in a plastic bag. 2. Place a towel between your skin and the bag. 3. Leave the ice on for 20 minutes, 2-3 times a day for the first 2-3 days. After that, ice and heat may be alternated to reduce pain and spasms. 8. Maintain a healthy weight. Excess weight puts extra stress on your back and makes it difficult to maintain good posture.   SEEK MEDICAL CARE IF: worsening pain, associated fever, rash/edema on area, pain going to legs  or buttocks, numbness/tingling, night pain, or abnormal weight loss.    SEEK IMMEDIATE MEDICAL CARE IF:  1. You develop new bowel or bladder control problems. 2. You have unusual weakness or numbness in your arms or legs. 3. You develop nausea or vomiting. 4. You develop abdominal pain. 5. You feel faint.     Back Exercises The following exercises strengthen the muscles that help to support the back. They also help to keep the lower back flexible. Doing these exercises can help to prevent back pain or lessen existing pain. If you have back pain or discomfort, try  doing these exercises 2-3 times each day or as told by your health care provider. When the pain goes away, do them once each day, but increase the number of times that you repeat the steps for each exercise (do more repetitions). If you do not have back pain or discomfort, do these exercises once each day or as told by your health care provider.   EXERCISES Single Knee to Chest Repeat these steps 3-5 times for each leg: 5. Lie on your back on a firm bed or the floor with your legs extended. 6. Bring one knee to your chest. Your other leg should stay extended and in contact with the floor. 7. Hold your knee in place by grabbing your knee or thigh. 8. Pull on your knee until you feel a gentle stretch in your lower back. 9. Hold the stretch for 10-30 seconds. 10. Slowly release and straighten your leg.  Pelvic Tilt Repeat these steps 5-10 times: 9. Lie on your back on a firm bed or the floor with your legs extended. [place] your knees so they are pointing toward the ceiling and your feet are flat on the floor. 22. Tighten your lower abdominal muscles to press your lower back against the floor. This motion will tilt your pelvis so your tailbone points up toward the ceiling instead of pointing to your feet or the floor. 12. With gentle tension and even breathing, hold this position for 5-10 seconds.  Cat-Cow Repeat these steps until your lower back becomes more flexible: 1. Get into a hands-and-knees position on a firm surface. Keep your hands under your shoulders, and keep your knees under your hips. You may place padding under your knees for comfort. 2. Let your head hang down, and point your tailbone toward the floor so your lower back becomes rounded like the back of a cat. 3. Hold this position for 5 seconds. 4. Slowly lift your head and point your tailbone up toward the ceiling so your back forms a sagging arch like the back of a cow. 5. Hold this position for 5  seconds.   Press-Ups Repeat these steps 5-10 times: 6. Lie on your abdomen (face-down) on the floor. 7. Place your palms near your head, about shoulder-width apart. 8. While you keep your back as relaxed as possible and keep your hips on the floor, slowly straighten your arms to raise the top half of your body and lift your shoulders. Do not use your back muscles to raise your upper torso. You may adjust the placement of your hands to make yourself more comfortable. 9. Hold this position for 5 seconds while you keep your back relaxed. 10. Slowly return to lying flat on the floor.   Bridges Repeat these steps 10 times: 1. Lie on your back on a firm surface. 2. Bend your knees so they are pointing toward the ceiling and your feet are  flat on the floor. 3. Tighten your buttocks muscles and lift your buttocks off of the floor until your waist is at almost the same height as your knees. You should feel the muscles working in your buttocks and the back of your thighs. If you do not feel these muscles, slide your feet 1-2 inches farther away from your buttocks. 4. Hold this position for 3-5 seconds. 5. Slowly lower your hips to the starting position, and allow your buttocks muscles to relax completely. If this exercise is too easy, try doing it with your arms crossed over your chest.

## 2016-03-29 ENCOUNTER — Telehealth: Payer: Self-pay | Admitting: Adult Health

## 2016-03-29 NOTE — Telephone Encounter (Signed)
Error/ltd ° °

## 2016-03-31 ENCOUNTER — Encounter: Payer: Self-pay | Admitting: Adult Health

## 2016-04-13 ENCOUNTER — Ambulatory Visit (INDEPENDENT_AMBULATORY_CARE_PROVIDER_SITE_OTHER): Payer: BLUE CROSS/BLUE SHIELD | Admitting: Adult Health

## 2016-04-13 ENCOUNTER — Encounter: Payer: Self-pay | Admitting: Adult Health

## 2016-04-13 VITALS — BP 142/78 | Temp 98.6°F | Wt 306.2 lb

## 2016-04-13 DIAGNOSIS — M545 Low back pain: Secondary | ICD-10-CM | POA: Diagnosis not present

## 2016-04-13 MED ORDER — KETOROLAC TROMETHAMINE 60 MG/2ML IM SOLN
60.0000 mg | Freq: Once | INTRAMUSCULAR | Status: AC
  Administered 2016-04-13: 60 mg via INTRAMUSCULAR

## 2016-04-13 MED ORDER — TRAMADOL HCL 50 MG PO TABS: 50.0000 mg | ORAL_TABLET | Freq: Three times a day (TID) | ORAL | Status: DC | PRN

## 2016-04-13 NOTE — Progress Notes (Signed)
Subjective:    Patient ID: Cheryl Stone, female    DOB: [birth year], 50 y.o.   MRN: [id number]  HPI  50 year old female who presents to the office for continued back pain. She saw Dr. Martinique on 03/28/2016 for this issue. Dr. Martinique prescribed Robaxin, 300mg  of Gabapentin and Toradol 50mg . She feels as though the lower back pain with radiating pain to the left groin continues to be unbearable. She no longer has upper back pain. She has to get up slowly from sitting and stretch her leg out before standing erect.   She has prior hx of back pain, herniated discs, 4-5 years, reporting lumbar MRI done in 2012. She has done PT in the past.  Pain is exacerbated by movement and alleviated by rest and stretching + local heat/ice. Pain is rated 10/10 and is described as a sharp/aching. She denies any saddle anesthesia, urinary or bowel incontinence.   Review of Systems  Musculoskeletal: Positive for back pain, arthralgias and gait problem. Negative for neck pain and neck stiffness.  Skin: Negative.   Neurological: Negative.   All other systems reviewed and are negative.  Past Medical History  Diagnosis Date  . Hypothyroidism   . Graves disease     1999 had iodine radiation  . Hypertension   . Fluid retention   . Acid reflux   . Back pain   . Osteoarthritis   . Morbid obesity ([place])     Social History   Social History  . Marital Status: Single    Spouse Name: N/A  . Number of Children: N/A  . Years of Education: N/A   Occupational History  . Not on file.   Social History Main Topics  . Smoking status: Never Smoker   . Smokeless tobacco: Never Used  . Alcohol Use: 0.0 oz/week    0 Standard drinks or equivalent per week     Comment: once every 2 months  . Drug Use: No  . Sexual Activity: Not on file   Other Topics Concern  . 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         Past Surgical History  Procedure Laterality Date  . Cholecystectomy    . Tubal ligation    . Uterine ablation      Family History  Problem Relation Age of Onset  . Sudden Cardiac Death Mother     Saw her sister get killed, which led to heart attack  . Hypertension Maternal Grandmother   . Alzheimer's disease Maternal Grandfather   . Colon cancer Neg Hx   . Thyroid disease Paternal Grandmother     Allergies  Allergen Reactions  . Lobster [Shellfish Allergy] Rash    Lobster  . Tylenol [Acetaminophen] Hives and Swelling    Current Outpatient Prescriptions on File Prior to Visit  Medication Sig Dispense Refill  . amLODipine (NORVASC) 5 MG tablet TAKE 1 TABLET (5 MG TOTAL) BY MOUTH DAILY. 90 tablet 1  . aspirin EC 81 MG tablet Take 162 mg by mouth once.    . Cholecalciferol (D-5000) 5000 UNITS TABS Take 1 tablet by mouth daily. Reported on 03/22/2016    . ferrous fumarate (HEMOCYTE - 106 MG FE) 325 (106 FE) MG TABS tablet Take 1 tablet by mouth daily as needed (iron deficiency).    . gabapentin (NEURONTIN) 300 MG capsule Take  1 capsule (300 mg total) by mouth 2 (two) times daily. 60 capsule 1  . hydrochlorothiazide (HYDRODIURIL) 25 MG tablet TAKE 1 TABLET (25 MG TOTAL) BY MOUTH DAILY. 90 tablet 1  . ibuprofen (ADVIL,MOTRIN) 800 MG tablet Take 1 tablet (800 mg total) by mouth 3 (three) times daily. 21 tablet 0  . levothyroxine (SYNTHROID, LEVOTHROID) 112 MCG tablet TAKE 2 TABLETS (224 MCG TOTAL) BY MOUTH DAILY BEFORE BREAKFAST FOR 6 DAYS A WEEK, AND THEN 1 TABLET ON THE 7TH DAY. 90 tablet 1  . Lorcaserin HCl 10 MG TABS Take 10 mg by mouth 2 (two) times daily. 60 tablet 0   No current facility-administered medications on file prior to visit.    BP 142/78 mmHg  Temp(Src) 98.6 F (37 C) (Oral)  Wt 306 lb 3.2 oz (138.891 kg)       Objective:   Physical Exam  Constitutional: She is oriented to person, place, and time. She appears well-developed and well-nourished. No  distress.  Cardiovascular: Normal rate, regular rhythm, normal heart sounds and intact distal pulses.  Exam reveals no gallop and no friction rub.   No murmur heard. Pulses:      Dorsalis pedis pulses are 2+ on the right side, and 2+ on the left side.       Posterior tibial pulses are 2+ on the right side, and 2+ on the left side.  Pulmonary/Chest: Effort normal and breath sounds normal. No respiratory distress. She has no wheezes. She has no rales. She exhibits no tenderness.  Musculoskeletal: Normal range of motion. She exhibits edema and tenderness.  Does have swelling to left hip, painful with palpation along top of gluteal muscle with radiating pain into left groin. No pain down distal side of left leg. No pain in calf.  No tenderness of bony structures/vertebral bodies.   Has limping gait   Neurological: She is alert and oriented to person, place, and time.  Skin: Skin is warm and dry. No rash noted. She is not diaphoretic. No erythema. No pallor.  Psychiatric: She has a normal mood and affect. Her behavior is normal. Judgment and thought content normal.  Nursing note and vitals reviewed.     Assessment & Plan:  1. Left low back pain, with sciatica presence unspecified  - MR Lumbar Spine Wo Contrast; Future - Ambulatory referral to Neurosurgery - ketorolac (TORADOL) injection 60 mg; Inject 2 mLs (60 mg total) into the muscle once. - traMADol (ULTRAM) 50 MG tablet; Take 1 tablet (50 mg total) by mouth every 8 (eight) hours as needed for severe pain.  Dispense: 30 tablet; Refill: 0 - Trial an increase of Gabapentin to 600 TID - stretching exercises and weight loss  Dorothyann Peng, NP

## 2016-04-13 NOTE — Patient Instructions (Addendum)
I am sorry you are still going through this.   I have sent in a referral to neurosurgery - someone will call you to schedule this appointment.   I would also like you to have an MRI of the lower spine.   Increase Gabapentin to 600 mg three times a day

## 2016-04-18 ENCOUNTER — Other Ambulatory Visit: Payer: BLUE CROSS/BLUE SHIELD

## 2016-04-18 ENCOUNTER — Other Ambulatory Visit (INDEPENDENT_AMBULATORY_CARE_PROVIDER_SITE_OTHER): Payer: BLUE CROSS/BLUE SHIELD

## 2016-04-18 ENCOUNTER — Other Ambulatory Visit: Payer: Self-pay | Admitting: *Deleted

## 2016-04-18 DIAGNOSIS — E038 Other specified hypothyroidism: Secondary | ICD-10-CM

## 2016-04-18 LAB — T4, FREE: FREE T4: 1.4 ng/dL (ref 0.60–1.60)

## 2016-04-18 LAB — TSH: TSH: 0.13 u[IU]/mL — AB (ref 0.35–4.50)

## 2016-04-20 ENCOUNTER — Ambulatory Visit (INDEPENDENT_AMBULATORY_CARE_PROVIDER_SITE_OTHER): Payer: BLUE CROSS/BLUE SHIELD | Admitting: Endocrinology

## 2016-04-20 VITALS — BP 108/70 | HR 69 | Temp 97.6°F | Resp 16 | Ht 66.0 in | Wt 303.4 lb

## 2016-04-20 DIAGNOSIS — E890 Postprocedural hypothyroidism: Secondary | ICD-10-CM | POA: Diagnosis not present

## 2016-04-20 MED ORDER — LEVOTHYROXINE SODIUM 175 MCG PO TABS: ORAL_TABLET | ORAL | Status: DC

## 2016-04-20 NOTE — Progress Notes (Signed)
Patient ID: Cheryl Stone, female   DOB: 07/24/1966, 50 y.o.   MRN: [id number]            Reason for Appointment:  Hypothyroidism, follow-up visit    History of Present Illness:    Hypothyroidism was first diagnosed in 03/1998  At that time patient was having symptoms of  fatigue, cold sensitivity and weight loss. She was found to be hyperthyroid and she was also having significant difficulty swallowing. She was told that her thyroid was the size of a golf ball and she was having Graves' disease She was given radioactive iodine  Subsequently her labs showed that she was hypothyroid and she was started on thyroid supplements.  At that time she was feeling fairly well. She was followed regularly before she moved here and apparently her levothyroxine dose has been increased stepwise About 2 years ago she was taking 200 g and now she has been taking a stable dose of 112 g, 2 tablets daily .            Recent history: She has been feeling somewhat fatigue at times, has had other medical issues also No dry skin or cold intolerance Since her TSH was low at 0.24 she was told to reduce her dose by 112 g per week but continued 224 g on the other days  THYROID nodule: She was sent for her thyroid ultrasound in 2015 and she was not told why. This showed a mostly solid 1.5 cm nodule on the right side.    Needle aspiration in 3/17 showed benign follicular nodule         Patient's weight history is as follows:  Wt Readings from Last 3 Encounters:  04/20/16 303 lb 6.4 oz (137.621 kg)  04/13/16 306 lb 3.2 oz (138.891 kg)  03/28/16 290 lb 3 oz (131.628 kg)    Thyroid function results have been as follows:  Lab Results  Component Value Date   FREET4 1.40 04/18/2016   FREET4 1.63* 01/15/2016   TSH 0.13* 04/18/2016   TSH 0.24* 01/15/2016   TSH 0.48 08/05/2015     Past Medical History  Diagnosis Date  . Hypothyroidism   . Graves disease     1999 had iodine radiation  .  Hypertension   . Fluid retention   . Acid reflux   . Back pain   . Osteoarthritis   . Morbid obesity [facility])     Past Surgical History  Procedure Laterality Date  . Cholecystectomy    . Tubal ligation    . Uterine ablation      Family History  Problem Relation Age of Onset  . Sudden Cardiac Death Mother     Saw her sister get killed, which led to heart attack  . Hypertension Maternal Grandmother   . Alzheimer's disease Maternal Grandfather   . Colon cancer Neg Hx   . Thyroid disease Paternal Grandmother     Social History:  reports that she has never smoked. She has never used smokeless tobacco. She reports that she drinks alcohol. She reports that she does not use illicit drugs.  Allergies:  Allergies  Allergen Reactions  . Lobster [Shellfish Allergy] Rash    Lobster  . Tylenol [Acetaminophen] Hives and Swelling      Medication List       This list is accurate as of: 04/20/16  1:47 PM.  Always use your most recent med list.  amLODipine 5 MG tablet  Commonly known as:  NORVASC  TAKE 1 TABLET (5 MG TOTAL) BY MOUTH DAILY.     aspirin EC 81 MG tablet  Take 162 mg by mouth once.     D-5000 5000 units Tabs  Generic drug:  Cholecalciferol  Take 1 tablet by mouth daily. Reported on 04/20/2016     ferrous fumarate 325 (106 Fe) MG Tabs tablet  Commonly known as:  HEMOCYTE - 106 mg FE  Take 1 tablet by mouth daily as needed (iron deficiency).     gabapentin 300 MG capsule  Commonly known as:  NEURONTIN  Take 1 capsule (300 mg total) by mouth 2 (two) times daily.     hydrochlorothiazide 25 MG tablet  Commonly known as:  HYDRODIURIL  TAKE 1 TABLET (25 MG TOTAL) BY MOUTH DAILY.     ibuprofen 800 MG tablet  Commonly known as:  ADVIL,MOTRIN  Take 1 tablet (800 mg total) by mouth 3 (three) times daily.     levothyroxine 175 MCG tablet  Commonly known as:  SYNTHROID  1 qd plus extra half on Sundays     Lorcaserin HCl 10 MG Tabs  Take 10 mg by mouth  2 (two) times daily.     traMADol 50 MG tablet  Commonly known as:  ULTRAM  Take 1 tablet (50 mg total) by mouth every 8 (eight) hours as needed for severe pain.          Review of Systems              Examination:    BP 108/70 mmHg  Pulse 69  Temp(Src) 97.6 F (36.4 C)  Resp 16  Ht 5\' 6"  (1.676 m)  Wt 303 lb 6.4 oz (137.621 kg)  BMI 48.99 kg/m2  SpO2 95%  Thyroid not palpable  Assessment:  HYPOTHYROIDISM, post radioactive iodine treatment, reportedly for Graves' disease  Despite reducing her dose by 112 g weekly she still has a low TSH Medically asymptomatic She continues to take generic medications, getting from [facility]  THYROID nodule: Benign on biopsy, no need for further evaluation or ultrasounds  PLAN:   Reduce levothyroxine to 175 g, 7/2 tablets a week  Follow-up in 3 months  Alesa Echevarria 04/20/2016, 1:47 PM

## 2016-04-23 ENCOUNTER — Other Ambulatory Visit: Payer: BLUE CROSS/BLUE SHIELD

## 2016-04-30 ENCOUNTER — Ambulatory Visit
Admission: RE | Admit: 2016-04-30 | Discharge: 2016-04-30 | Disposition: A | Discharge status: Home / Self Care | Payer: BLUE CROSS/BLUE SHIELD | Source: Ambulatory Visit | Attending: Adult Health | Admitting: Adult Health

## 2016-04-30 DIAGNOSIS — M545 Low back pain: Secondary | ICD-10-CM

## 2016-05-05 ENCOUNTER — Telehealth: Payer: Self-pay | Admitting: Adult Health

## 2016-05-05 NOTE — Telephone Encounter (Signed)
Left vm to call back regarding imaging results

## 2016-05-05 NOTE — Telephone Encounter (Signed)
Spoke to [employer] and informed her of her MRI results. She has an upcoming appointment with neurosurgery

## 2016-05-29 ENCOUNTER — Other Ambulatory Visit: Payer: Self-pay | Admitting: Family Medicine

## 2016-05-31 ENCOUNTER — Encounter: Payer: Self-pay | Admitting: Adult Health

## 2016-05-31 ENCOUNTER — Ambulatory Visit (INDEPENDENT_AMBULATORY_CARE_PROVIDER_SITE_OTHER): Payer: BLUE CROSS/BLUE SHIELD | Admitting: Adult Health

## 2016-05-31 VITALS — BP 128/74 | HR 60 | Temp 98.1°F | Resp 14 | Ht 66.0 in | Wt 305.7 lb

## 2016-05-31 DIAGNOSIS — R202 Paresthesia of skin: Secondary | ICD-10-CM | POA: Diagnosis not present

## 2016-05-31 DIAGNOSIS — R200 Anesthesia of skin: Secondary | ICD-10-CM

## 2016-05-31 MED ORDER — CHOLECALCIFEROL 125 MCG (5000 UT) PO TABS: 1.0000 | ORAL_TABLET | Freq: Every day | ORAL | Status: DC

## 2016-05-31 NOTE — Progress Notes (Signed)
Subjective:    Patient ID: Cheryl Stone, female    DOB: June 22, 1966, 50 y.o.   MRN: [id number]  HPI  50 year old female who presents to the office today for 24 hours of numbness in right pointer finger. She reports that when she woke from her nap that she noticed that the distal part of her right pointer finger was numbness. The numbness can radiate up her forearm.   No numbness that radiates down the shoulder.   She does a lot of typing.   Review of Systems  Constitutional: Negative.   Respiratory: Negative.   Cardiovascular: Negative.   Neurological: Positive for numbness.  All other systems reviewed and are negative.  Past Medical History  Diagnosis Date  . Hypothyroidism   . Graves disease     1999 had iodine radiation  . Hypertension   . Fluid retention   . Acid reflux   . Back pain   . Osteoarthritis   . Morbid obesity ([place])     Social History   Social History  . Marital Status: Single    Spouse Name: N/A  . Number of Children: N/A  . Years of Education: N/A   Occupational History  . Not on file.   Social History Main Topics  . Smoking status: Never Smoker   . Smokeless tobacco: Never Used  . Alcohol Use: 0.0 oz/week    0 Standard drinks or equivalent per week     Comment: once every 2 months  . Drug Use: No  . Sexual Activity: Not on file   Other Topics Concern  . 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        Past Surgical History  Procedure Laterality Date  . Cholecystectomy    . Tubal ligation    . Uterine ablation      Family History  Problem Relation Age of Onset  . Sudden Cardiac Death Mother     Saw her sister get killed, which led to heart attack  . Hypertension Maternal Grandmother   . Alzheimer's disease Maternal Grandfather   . Colon cancer Neg Hx   . Thyroid disease Paternal Grandmother     Allergies  Allergen  Reactions  . Lobster [Shellfish Allergy] Rash    Lobster  . Tylenol [Acetaminophen] Hives and Swelling    Current Outpatient Prescriptions on File Prior to Visit  Medication Sig Dispense Refill  . amLODipine (NORVASC) 5 MG tablet TAKE 1 TABLET (5 MG TOTAL) BY MOUTH DAILY. 90 tablet 1  . ferrous fumarate (HEMOCYTE - 106 MG FE) 325 (106 FE) MG TABS tablet Take 1 tablet by mouth daily as needed (iron deficiency).    . gabapentin (NEURONTIN) 300 MG capsule TAKE 1 CAPSULE (300 MG TOTAL) BY MOUTH 2 (TWO) TIMES DAILY. 60 capsule 1  . hydrochlorothiazide (HYDRODIURIL) 25 MG tablet TAKE 1 TABLET (25 MG TOTAL) BY MOUTH DAILY. 90 tablet 1  . levothyroxine (SYNTHROID) 175 MCG tablet 1 qd plus extra half on Sundays 102 tablet 3  . Lorcaserin HCl 10 MG TABS Take 10 mg by mouth 2 (two) times daily. 60 tablet 0  . traMADol (ULTRAM) 50 MG tablet Take 1 tablet (50 mg total) by mouth every 8 (eight) hours as needed for severe pain. 30 tablet 0  . aspirin EC 81 MG tablet Take 162 mg by mouth once.  Reported on 05/31/2016    . ibuprofen (ADVIL,MOTRIN) 800 MG tablet Take 1 tablet (800 mg total) by mouth 3 (three) times daily. (Patient not taking: Reported on 04/20/2016) 21 tablet 0   No current facility-administered medications on file prior to visit.    BP 128/74 mmHg  Pulse 60  Temp(Src) 98.1 F (36.7 C) (Oral)  Resp 14  Ht 5\' 6"  (1.676 m)  Wt 305 lb 11.2 oz (138.665 kg)  BMI 49.36 kg/m2  SpO2 98%  LMP 05/07/2016       Objective:   Physical Exam  Constitutional: She is oriented to person, place, and time. She appears well-developed and well-nourished. No distress.  Cardiovascular: Normal rate, regular rhythm, normal heart sounds and intact distal pulses.  Exam reveals no friction rub.   No murmur heard. Pulmonary/Chest: Effort normal and breath sounds normal. No respiratory distress. She has no wheezes. She has no rales. She exhibits no tenderness.  Neurological: She is alert and oriented to  person, place, and time. She has normal reflexes. She displays normal reflexes. No cranial nerve deficit. She exhibits normal muscle tone. Coordination normal.  + tinel and phalens + numbness along ulnar nerve as well.   Skin: Skin is warm and dry. No rash noted. She is not diaphoretic. No erythema. No pallor.  Psychiatric: She has a normal mood and affect. Her behavior is normal. Thought content normal.  Vitals reviewed.     Assessment & Plan:  1. Numbness and tingling in right hand - Likely carpal tunnel but may be ulnar nerve entrampement - NSAIDS every 8 hours x 3 days - right wrist splint ( did not have any in the office that would fit her) - Follow up if no improvement - Consider oral prednisone or sports medicine referral   Dorothyann Peng, NP

## 2016-05-31 NOTE — Patient Instructions (Addendum)
Your exam is consistent with carpal tunnel syndrome  Start ibuprofen 600mg  every 8 hours for 3 days.   You can also use a  Wrist splint.   Follow up if no improvement  Carpal Tunnel Syndrome Carpal tunnel syndrome is a condition that causes pain in your hand and arm. The carpal tunnel is a narrow area located on the palm side of your wrist. Repeated wrist motion or certain diseases may cause swelling within the tunnel. This swelling pinches the main nerve in the wrist (median nerve). CAUSES  This condition may be caused by:   Repeated wrist motions.  Wrist injuries.  Arthritis.  A cyst or tumor in the carpal tunnel.  Fluid buildup during pregnancy. Sometimes the cause of this condition is not known.  RISK FACTORS This condition is more likely to develop in:   People who have jobs that cause them to repeatedly move their wrists in the same motion, such as butchers and cashiers.  Women.  People with certain conditions, such as:  Diabetes.  Obesity.  An underactive thyroid (hypothyroidism).  Kidney failure. SYMPTOMS  Symptoms of this condition include:   A tingling feeling in your fingers, especially in your thumb, index, and middle fingers.  Tingling or numbness in your hand.  An aching feeling in your entire arm, especially when your wrist and elbow are bent for long periods of time.  Wrist pain that goes up your arm to your shoulder.  Pain that goes down into your palm or fingers.  A weak feeling in your hands. You may have trouble grabbing and holding items. Your symptoms may feel worse during the night.  DIAGNOSIS  This condition is diagnosed with a medical history and physical exam. You may also have tests, including:   An electromyogram (EMG). This test measures electrical signals sent by your nerves into the muscles.  X-rays. TREATMENT  Treatment for this condition includes:  Lifestyle changes. It is important to stop doing or modify the activity  that caused your condition.  Physical or occupational therapy.  Medicines for pain and inflammation. This may include medicine that is injected into your wrist.  A wrist splint.  Surgery. HOME CARE INSTRUCTIONS  If You Have a Splint:  Wear it as told by your health care provider. Remove it only as told by your health care provider.  Loosen the splint if your fingers become numb and tingle, or if they turn cold and blue.  Keep the splint clean and dry. General Instructions  Take over-the-counter and prescription medicines only as told by your health care provider.  Rest your wrist from any activity that may be causing your pain. If your condition is work related, talk to your employer about changes that can be made, such as getting a wrist pad to use while typing.  If directed, apply ice to the painful area:  Put ice in a plastic bag.  Place a towel between your skin and the bag.  Leave the ice on for 20 minutes, 2-3 times per day.  Keep all follow-up visits as told by your health care provider. This is important.  Do any exercises as told by your health care provider, physical therapist, or occupational therapist. [place] IF:   You have new symptoms.  Your pain is not controlled with medicines.  Your symptoms get worse.   This information is not intended to replace advice given to you by your health care provider. Make sure you discuss any questions you have  with your health care provider.   Document Released: 11/18/2000 Document Revised: 08/12/2015 Document Reviewed: 04/08/2015 Elsevier Interactive Patient Education [employer].

## 2016-06-06 ENCOUNTER — Encounter: Payer: Self-pay | Admitting: Adult Health

## 2016-06-06 ENCOUNTER — Other Ambulatory Visit: Payer: Self-pay | Admitting: Adult Health

## 2016-06-06 DIAGNOSIS — M545 Low back pain: Secondary | ICD-10-CM

## 2016-06-09 ENCOUNTER — Other Ambulatory Visit: Payer: Self-pay

## 2016-06-09 DIAGNOSIS — M545 Low back pain: Secondary | ICD-10-CM

## 2016-06-09 MED ORDER — TRAMADOL HCL 50 MG PO TABS: 50.0000 mg | ORAL_TABLET | Freq: Three times a day (TID) | ORAL | Status: DC | PRN

## 2016-07-12 ENCOUNTER — Other Ambulatory Visit: Payer: BLUE CROSS/BLUE SHIELD

## 2016-07-14 ENCOUNTER — Ambulatory Visit: Payer: BLUE CROSS/BLUE SHIELD | Admitting: Endocrinology

## 2016-07-18 ENCOUNTER — Other Ambulatory Visit: Payer: BLUE CROSS/BLUE SHIELD

## 2016-07-21 ENCOUNTER — Ambulatory Visit: Payer: BLUE CROSS/BLUE SHIELD | Admitting: Endocrinology

## 2016-07-21 DIAGNOSIS — Z0289 Encounter for other administrative examinations: Secondary | ICD-10-CM

## 2016-07-25 ENCOUNTER — Other Ambulatory Visit: Payer: BLUE CROSS/BLUE SHIELD

## 2016-07-28 ENCOUNTER — Other Ambulatory Visit: Payer: Self-pay | Admitting: Adult Health

## 2016-08-01 ENCOUNTER — Encounter: Payer: No Typology Code available for payment source | Admitting: Adult Health

## 2016-08-02 ENCOUNTER — Encounter: Payer: Self-pay | Admitting: Adult Health

## 2016-08-02 ENCOUNTER — Encounter: Payer: BLUE CROSS/BLUE SHIELD | Admitting: Adult Health

## 2016-08-02 ENCOUNTER — Ambulatory Visit (INDEPENDENT_AMBULATORY_CARE_PROVIDER_SITE_OTHER): Payer: BLUE CROSS/BLUE SHIELD | Admitting: Adult Health

## 2016-08-02 VITALS — BP 110/78 | Temp 98.1°F | Ht 66.0 in | Wt 291.8 lb

## 2016-08-02 DIAGNOSIS — Z0000 Encounter for general adult medical examination without abnormal findings: Secondary | ICD-10-CM

## 2016-08-02 DIAGNOSIS — Z760 Encounter for issue of repeat prescription: Secondary | ICD-10-CM

## 2016-08-02 DIAGNOSIS — M545 Low back pain: Secondary | ICD-10-CM

## 2016-08-02 DIAGNOSIS — E039 Hypothyroidism, unspecified: Secondary | ICD-10-CM

## 2016-08-02 DIAGNOSIS — I10 Essential (primary) hypertension: Secondary | ICD-10-CM

## 2016-08-02 MED ORDER — LORCASERIN HCL 10 MG PO TABS: 10.0000 mg | ORAL_TABLET | Freq: Two times a day (BID) | ORAL | 0 refills | Status: DC

## 2016-08-02 MED ORDER — TRAMADOL HCL 50 MG PO TABS: 50.0000 mg | ORAL_TABLET | Freq: Three times a day (TID) | ORAL | 0 refills | Status: DC | PRN

## 2016-08-02 NOTE — Patient Instructions (Addendum)
It was great seeing you again!  Congratulations on all you have accomplished. Keep up the amazing work   Follow up with me in one year or sooner if needed  If you need anything, please let me know  Health Maintenance, Female Adopting a healthy lifestyle and getting preventive care can go a long way to promote health and wellness. Talk with your health care provider about what schedule of regular examinations is right for you. This is a good chance for you to check in with your provider about disease prevention and staying healthy. In between checkups, there are plenty of things you can do on your own. Experts have done a lot of research about which lifestyle changes and preventive measures are most likely to keep you healthy. Ask your health care provider for more information. WEIGHT AND DIET  Eat a healthy diet  Be sure to include plenty of vegetables, fruits, low-fat dairy products, and lean protein.  Do not eat a lot of foods high in solid fats, added sugars, or salt.  Get regular exercise. This is one of the most important things you can do for your health.  Most adults should exercise for at least 150 minutes each week. The exercise should increase your heart rate and make you sweat (moderate-intensity exercise).  Most adults should also do strengthening exercises at least twice a week. This is in addition to the moderate-intensity exercise.  Maintain a healthy weight  Body mass index (BMI) is a measurement that can be used to identify possible weight problems. It estimates body fat based on height and weight. Your health care provider can help determine your BMI and help you achieve or maintain a healthy weight.  For females 13 years of age and older:   A BMI below 18.5 is considered underweight.  A BMI of 18.5 to 24.9 is normal.  A BMI of 25 to 29.9 is considered overweight.  A BMI of 30 and above is considered obese.  Watch levels of cholesterol and blood lipids  You  should start having your blood tested for lipids and cholesterol at 52 years of age, then have this test every 5 years.  You may need to have your cholesterol levels checked more often if:  Your lipid or cholesterol levels are high.  You are older than 52 years of age.  You are at high risk for heart disease.  CANCER SCREENING   Lung Cancer  Lung cancer screening is recommended for adults 22-21 years old who are at high risk for lung cancer because of a history of smoking.  A yearly low-dose CT scan of the lungs is recommended for people who:  Currently smoke.  Have quit within the past 15 years.  Have at least a 30-pack-year history of smoking. A pack year is smoking an average of one pack of cigarettes a day for 1 year.  Yearly screening should continue until it has been 15 years since you quit.  Yearly screening should stop if you develop a health problem that would prevent you from having lung cancer treatment.  Breast Cancer  Practice breast self-awareness. This means understanding how your breasts normally appear and feel.  It also means doing regular breast self-exams. Let your health care provider know about any changes, no matter how small.  If you are in your 20s or 30s, you should have a clinical breast exam (CBE) by a health care provider every 1-3 years as part of a regular health exam.  If  you are 40 or older, have a CBE every year. Also consider having a breast X-ray (mammogram) every year.  If you have a family history of breast cancer, talk to your health care provider about genetic screening.  If you are at high risk for breast cancer, talk to your health care provider about having an MRI and a mammogram every year.  Breast cancer gene (BRCA) assessment is recommended for women who have family members with BRCA-related cancers. BRCA-related cancers include:  Breast.  Ovarian.  Tubal.  Peritoneal cancers.  Results of the assessment will determine  the need for genetic counseling and BRCA1 and BRCA2 testing. Cervical Cancer Your health care provider may recommend that you be screened regularly for cancer of the pelvic organs (ovaries, uterus, and vagina). This screening involves a pelvic examination, including checking for microscopic changes to the surface of your cervix (Pap test). You may be encouraged to have this screening done every 3 years, beginning at age 21.  For women ages 30-65, health care providers may recommend pelvic exams and Pap testing every 3 years, or they may recommend the Pap and pelvic exam, combined with testing for human papilloma virus (HPV), every 5 years. Some types of HPV increase your risk of cervical cancer. Testing for HPV may also be done on women of any age with unclear Pap test results.  Other health care providers may not recommend any screening for nonpregnant women who are considered low risk for pelvic cancer and who do not have symptoms. Ask your health care provider if a screening pelvic exam is right for you.  If you have had past treatment for cervical cancer or a condition that could lead to cancer, you need Pap tests and screening for cancer for at least 20 years after your treatment. If Pap tests have been discontinued, your risk factors (such as having a new sexual partner) need to be reassessed to determine if screening should resume. Some women have medical problems that increase the chance of getting cervical cancer. In these cases, your health care provider may recommend more frequent screening and Pap tests. Colorectal Cancer  This type of cancer can be detected and often prevented.  Routine colorectal cancer screening usually begins at 52 years of age and continues through 52 years of age.  Your health care provider may recommend screening at an earlier age if you have risk factors for colon cancer.  Your health care provider may also recommend using home test kits to check for hidden blood  in the stool.  A small camera at the end of a tube can be used to examine your colon directly (sigmoidoscopy or colonoscopy). This is done to check for the earliest forms of colorectal cancer.  Routine screening usually begins at age 50.  Direct examination of the colon should be repeated every 5-10 years through 52 years of age. However, you may need to be screened more often if early forms of precancerous polyps or small growths are found. Skin Cancer  Check your skin from head to toe regularly.  Tell your health care provider about any new moles or changes in moles, especially if there is a change in a mole's shape or color.  Also tell your health care provider if you have a mole that is larger than the size of a pencil eraser.  Always use sunscreen. Apply sunscreen liberally and repeatedly throughout the day.  Protect yourself by wearing long sleeves, pants, a wide-brimmed hat, and sunglasses whenever you are   outside. HEART DISEASE, DIABETES, AND HIGH BLOOD PRESSURE   High blood pressure causes heart disease and increases the risk of stroke. High blood pressure is more likely to develop in:  People who have blood pressure in the high end of the normal range (130-139/85-89 mm Hg).  People who are overweight or obese.  People who are African American.  If you are 18-39 years of age, have your blood pressure checked every 3-5 years. If you are 40 years of age or older, have your blood pressure checked every year. You should have your blood pressure measured twice--once when you are at a hospital or clinic, and once when you are not at a hospital or clinic. Record the average of the two measurements. To check your blood pressure when you are not at a hospital or clinic, you can use:  An automated blood pressure machine at a pharmacy.  A home blood pressure monitor.  If you are between 55 years and 79 years old, ask your health care provider if you should take aspirin to prevent  strokes.  Have regular diabetes screenings. This involves taking a blood sample to check your fasting blood sugar level.  If you are at a normal weight and have a low risk for diabetes, have this test once every three years after 52 years of age.  If you are overweight and have a high risk for diabetes, consider being tested at a younger age or more often. PREVENTING INFECTION  Hepatitis B  If you have a higher risk for hepatitis B, you should be screened for this virus. You are considered at high risk for hepatitis B if:  You were born in a country where hepatitis B is common. Ask your health care provider which countries are considered high risk.  Your parents were born in a high-risk country, and you have not been immunized against hepatitis B (hepatitis B vaccine).  You have HIV or AIDS.  You use needles to inject street drugs.  You live with someone who has hepatitis B.  You have had sex with someone who has hepatitis B.  You get hemodialysis treatment.  You take certain medicines for conditions, including cancer, organ transplantation, and autoimmune conditions. Hepatitis C  Blood testing is recommended for:  Everyone born from 1945 through 1965.  Anyone with known risk factors for hepatitis C. Sexually transmitted infections (STIs)  You should be screened for sexually transmitted infections (STIs) including gonorrhea and chlamydia if:  You are sexually active and are younger than 52 years of age.  You are older than 52 years of age and your health care provider tells you that you are at risk for this type of infection.  Your sexual activity has changed since you were last screened and you are at an increased risk for chlamydia or gonorrhea. Ask your health care provider if you are at risk.  If you do not have HIV, but are at risk, it may be recommended that you take a prescription medicine daily to prevent HIV infection. This is called pre-exposure prophylaxis  (PrEP). You are considered at risk if:  You are sexually active and do not regularly use condoms or know the HIV status of your partner(s).  You take drugs by injection.  You are sexually active with a partner who has HIV. Talk with your health care provider about whether you are at high risk of being infected with HIV. If you choose to begin PrEP, you should first be tested for HIV.   You should then be tested every 3 months for as long as you are taking PrEP.  PREGNANCY   If you are premenopausal and you may become pregnant, ask your health care provider about preconception counseling.  If you may become pregnant, take 400 to 800 micrograms (mcg) of folic acid every day.  If you want to prevent pregnancy, talk to your health care provider about birth control (contraception). OSTEOPOROSIS AND MENOPAUSE   Osteoporosis is a disease in which the bones lose minerals and strength with aging. This can result in serious bone fractures. Your risk for osteoporosis can be identified using a bone density scan.  If you are 36 years of age or older, or if you are at risk for osteoporosis and fractures, ask your health care provider if you should be screened.  Ask your health care provider whether you should take a calcium or vitamin D supplement to lower your risk for osteoporosis.  Menopause may have certain physical symptoms and risks.  Hormone replacement therapy may reduce some of these symptoms and risks. Talk to your health care provider about whether hormone replacement therapy is right for you.  HOME CARE INSTRUCTIONS   Schedule regular health, dental, and eye exams.  Stay current with your immunizations.   Do not use any tobacco products including cigarettes, chewing tobacco, or electronic cigarettes.  If you are pregnant, do not drink alcohol.  If you are breastfeeding, limit how much and how often you drink alcohol.  Limit alcohol intake to no more than 1 drink per day for  nonpregnant women. One drink equals 12 ounces of beer, 5 ounces of wine, or 1 ounces of hard liquor.  Do not use street drugs.  Do not share needles.  Ask your health care provider for help if you need support or information about quitting drugs.  Tell your health care provider if you often feel depressed.  Tell your health care provider if you have ever been abused or do not feel safe at home.   This information is not intended to replace advice given to you by your health care provider. Make sure you discuss any questions you have with your health care provider.   Document Released: 06/06/2011 Document Revised: 12/12/2014 Document Reviewed: 10/23/2013 Elsevier Interactive Patient Education [employer].

## 2016-08-02 NOTE — Progress Notes (Signed)
Subjective:    Patient ID: Cheryl Stone, female    DOB: 04-27-1966, 50 y.o.   MRN: [id number]  HPI  Patient presents for yearly preventative medicine examination. She is a pleasant 50 year old female who  has a past medical history of Acid reflux; Back pain; Fluid retention; Graves disease; Hypertension; Hypothyroidism; Morbid obesity ([place]); and Osteoarthritis.   All immunizations and health maintenance protocols were reviewed with the patient and needed orders were placed.  Medication reconciliation,  past medical history, social history, problem list and allergies were reviewed in detail with the patient  Goals were established with regard to weight loss, exercise, and  diet in compliance with medications. She is exercising and is eating healthy. She has not had any sodas or red meats in over a month and she feels " really good".    She had her labs done at [place] [facility] at the [place] of the month. I have reviewed them and her labs are WNL. She is looking to have the gastric sleeve procedure done.   She is up today date on Colonoscopy and mammogram. She has her pap in October.    Review of Systems  Constitutional: Negative.   HENT: Negative.   Eyes: Negative.   Respiratory: Negative.   Cardiovascular: Negative.   Gastrointestinal: Negative.   Endocrine: Negative.   Genitourinary: Negative.   Musculoskeletal: Positive for arthralgias (chronic in left knee). Negative for gait problem and joint swelling.  Skin: Negative.   Allergic/Immunologic: Negative.   Neurological: Negative.   Hematological: Negative.   Psychiatric/Behavioral: Negative.   All other systems reviewed and are negative.  Past Medical History:  Diagnosis Date  . Acid reflux   . Back pain   . Fluid retention   . Graves disease    1999 had iodine radiation  . Hypertension   . Hypothyroidism   . Morbid obesity ([place])   . Osteoarthritis     Social History   Social History  .  Marital status: Single    Spouse name: N/A  . Number of children: N/A  . Years of education: N/A   Occupational History  . Not on file.   Social History Main Topics  . Smoking status: Never Smoker  . Smokeless tobacco: Never Used  . Alcohol use 0.0 oz/week     Comment: once every 2 months  . Drug use: No  . Sexual activity: Not on file   Other Topics Concern  . 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        Past Surgical History:  Procedure Laterality Date  . CHOLECYSTECTOMY    . TUBAL LIGATION    . Uterine Ablation      Family History  Problem Relation Age of Onset  . Sudden Cardiac Death Mother     Saw her sister get killed, which led to heart attack  . Hypertension Maternal Grandmother   . Alzheimer's disease Maternal Grandfather   . Colon cancer Neg Hx   . Thyroid disease Paternal Grandmother     Allergies  Allergen Reactions  . Lobster [Shellfish Allergy] Rash    Lobster  . Tylenol [Acetaminophen] Hives and Swelling    Current Outpatient Prescriptions on File Prior to Visit  Medication Sig Dispense Refill  . amLODipine (NORVASC) 5 MG tablet TAKE 1 TABLET (5 MG TOTAL) BY MOUTH  DAILY. 90 tablet 1  . aspirin EC 81 MG tablet Take 162 mg by mouth once. Reported on 05/31/2016    . Cholecalciferol (D-5000) 5000 units TABS Take 1 tablet (5,000 Units total) by mouth daily. Reported on 05/31/2016 90 tablet 2  . ferrous fumarate (HEMOCYTE - 106 MG FE) 325 (106 FE) MG TABS tablet Take 1 tablet by mouth daily as needed (iron deficiency).    . hydrochlorothiazide (HYDRODIURIL) 25 MG tablet TAKE 1 TABLET (25 MG TOTAL) BY MOUTH DAILY. 90 tablet 1  . ibuprofen (ADVIL,MOTRIN) 800 MG tablet Take 1 tablet (800 mg total) by mouth 3 (three) times daily. (Patient not taking: Reported on 04/20/2016) 21 tablet 0  . levothyroxine (SYNTHROID) 175 MCG tablet 1 qd plus extra half on  Sundays 102 tablet 3   No current facility-administered medications on file prior to visit.     BP 110/78   Temp 98.1 F (36.7 C) (Oral)   Ht 5\' 6"  (1.676 m)   Wt 291 lb 12.8 oz (132.4 kg)   BMI 47.10 kg/m       Objective:   Physical Exam  Constitutional: She is oriented to person, place, and time. She appears well-developed and well-nourished. No distress.  obese  HENT:  Head: Normocephalic and atraumatic.  Right Ear: Hearing, tympanic membrane, external ear and ear canal normal.  Left Ear: Hearing, tympanic membrane and external ear normal.  Nose: Nose normal.  Mouth/Throat: Oropharynx is clear and moist. No oropharyngeal exudate.  Eyes: Conjunctivae and EOM are normal. Pupils are equal, round, and reactive to light. Right eye exhibits no discharge. Left eye exhibits no discharge. No scleral icterus.  Neck: Trachea normal and normal range of motion. Neck supple. No JVD present. Carotid bruit is not present. No tracheal deviation present. No thyroid mass and no thyromegaly present.  Cardiovascular: Normal rate, regular rhythm, normal heart sounds and intact distal pulses.  Exam reveals no gallop and no friction rub.   No murmur heard. Pulmonary/Chest: Effort normal and breath sounds normal. No stridor. No respiratory distress. She has no wheezes. She has no rales. She exhibits no tenderness.  Abdominal: Soft. Bowel sounds are normal. She exhibits no distension and no mass. There is no tenderness. There is no rebound and no guarding.  Genitourinary:  Genitourinary Comments: Refused GYN and Breast Exam   Musculoskeletal: Normal range of motion. She exhibits no edema, tenderness or deformity.  Lymphadenopathy:    She has no cervical adenopathy.  Neurological: She is alert and oriented to person, place, and time. She has normal reflexes. She displays normal reflexes. No cranial nerve deficit. She exhibits normal muscle tone. Coordination normal.  Skin: Skin is warm and dry. No rash  noted. She is not diaphoretic. No erythema. No pallor.  Psychiatric: She has a normal mood and affect. Her behavior is normal. Judgment and thought content normal.  Nursing note and vitals reviewed.     Assessment & Plan:  1. Routine general medical examination at a health care facility - Follow up in one year for CPE - Follow up sooner if needed - Continue to exercise and eat healthy  2. Medication refill - traMADol (ULTRAM) 50 MG tablet; Take 1 tablet (50 mg total) by mouth every 8 (eight) hours as needed for severe pain.  Dispense: 30 tablet; Refill: 0 - Lorcaserin HCl (BELVIQ) 10 MG TABS; Take 10 mg by mouth 2 (two) times daily.  Dispense: 60 tablet; Refill: 0  3. Morbid obesity due to excess calories ([place]) -  she has lost weight and is eating healthy.  - She exercises almost daily Wt Readings from Last 3 Encounters:  08/02/16 291 lb 12.8 oz (132.4 kg)  05/31/16 (!) 305 lb 11.2 oz (138.7 kg)  04/20/16 (!) 303 lb 6.4 oz (137.6 kg)    4. Essential hypertension - Well controlled on current medication - No change in therapy.   5. Hypothyroidism, unspecified hypothyroidism type - TSH 0.608 - No change in medication at this time  Dorothyann Peng, NP

## 2016-08-09 ENCOUNTER — Encounter: Payer: Self-pay | Admitting: Adult Health

## 2016-08-11 ENCOUNTER — Encounter: Payer: Self-pay | Admitting: Adult Health

## 2016-08-22 ENCOUNTER — Encounter: Payer: Self-pay | Admitting: Adult Health

## 2016-08-24 ENCOUNTER — Other Ambulatory Visit: Payer: Self-pay | Admitting: Adult Health

## 2016-08-24 DIAGNOSIS — Z760 Encounter for issue of repeat prescription: Secondary | ICD-10-CM

## 2016-08-26 ENCOUNTER — Other Ambulatory Visit: Payer: Self-pay | Admitting: Adult Health

## 2016-08-26 DIAGNOSIS — M25561 Pain in right knee: Secondary | ICD-10-CM

## 2016-08-26 DIAGNOSIS — M25562 Pain in left knee: Principal | ICD-10-CM

## 2016-08-26 DIAGNOSIS — Z760 Encounter for issue of repeat prescription: Secondary | ICD-10-CM

## 2016-08-26 MED ORDER — TRAMADOL HCL 50 MG PO TABS: 50.0000 mg | ORAL_TABLET | Freq: Three times a day (TID) | ORAL | 0 refills | Status: DC | PRN

## 2016-08-26 NOTE — Telephone Encounter (Signed)
OK to refill

## 2016-09-06 ENCOUNTER — Other Ambulatory Visit: Payer: Self-pay | Admitting: Adult Health

## 2016-09-06 DIAGNOSIS — Z760 Encounter for issue of repeat prescription: Secondary | ICD-10-CM

## 2016-09-06 NOTE — Telephone Encounter (Signed)
OK to refill

## 2016-09-06 NOTE — Telephone Encounter (Signed)
Left message for patient to return phone call.  

## 2016-09-06 NOTE — Telephone Encounter (Signed)
What is her current weight?

## 2016-09-07 MED ORDER — LORCASERIN HCL 10 MG PO TABS: 10.0000 mg | ORAL_TABLET | Freq: Two times a day (BID) | ORAL | 0 refills | Status: DC

## 2016-09-07 NOTE — Telephone Encounter (Signed)
Rx called in as directed.   

## 2016-09-07 NOTE — Telephone Encounter (Signed)
Patient states her current weight is 287.

## 2016-09-07 NOTE — Telephone Encounter (Signed)
Ok to refill 

## 2016-09-08 ENCOUNTER — Telehealth: Payer: Self-pay

## 2016-09-08 NOTE — Telephone Encounter (Signed)
PA Approved & form faxed back to pharmacy.

## 2016-09-08 NOTE — Telephone Encounter (Signed)
Received PA request for Belviq 10mg  tablets. PA submitted & is pending. Key: [id number]

## 2016-09-14 ENCOUNTER — Ambulatory Visit: Payer: BLUE CROSS/BLUE SHIELD | Admitting: Physical Therapy

## 2016-09-26 ENCOUNTER — Ambulatory Visit: Payer: BLUE CROSS/BLUE SHIELD | Attending: Adult Health

## 2016-09-26 DIAGNOSIS — M25561 Pain in right knee: Secondary | ICD-10-CM | POA: Insufficient documentation

## 2016-09-26 DIAGNOSIS — M6281 Muscle weakness (generalized): Secondary | ICD-10-CM | POA: Insufficient documentation

## 2016-09-26 DIAGNOSIS — R2689 Other abnormalities of gait and mobility: Secondary | ICD-10-CM | POA: Diagnosis present

## 2016-09-26 DIAGNOSIS — G8929 Other chronic pain: Secondary | ICD-10-CM

## 2016-09-26 DIAGNOSIS — M25562 Pain in left knee: Secondary | ICD-10-CM | POA: Insufficient documentation

## 2016-09-26 NOTE — Therapy (Signed)
[facility] [address] [place], [place], [address] Phone: [phone]   Fax:  [phone]  Physical Therapy Treatment  Patient Details  Name: Cheryl Stone MRN: [id number] Date of Birth: 05-14-1966 Referring Provider: Dorothyann Peng, NP  Encounter Date: 09/26/2016      PT End of Session - 09/26/16 1613    Visit Number 1   Date for PT Re-Evaluation 11/21/16   PT Start Time 1532   PT Stop Time 1609   PT Time Calculation (min) 37 min   Activity Tolerance Patient tolerated treatment well   Behavior During Therapy [facility] for tasks assessed/performed      Past Medical History:  Diagnosis Date  . Acid reflux   . Back pain   . Fluid retention   . Gallstones   . Graves disease    1999 had iodine radiation  . Graves disease    RAI ablation   . Hypertension   . Hypothyroidism   . Morbid obesity ([place])   . Osteoarthritis     Past Surgical History:  Procedure Laterality Date  . CHOLECYSTECTOMY    . TUBAL LIGATION    . Uterine Ablation      There were no vitals filed for this visit.      Subjective Assessment - 09/26/16 1535    Subjective Pt presents to PT with chronic history of Lt>Rt knee pain.  No incident or injury.     Pertinent History Pt reports that MD did cortizone injections into knees    Limitations Walking;Sitting;Standing   How long can you sit comfortably? 1 hour at work   How long can you walk comfortably? pain after doing this for long periods, not limit at the time.     Diagnostic tests none recent   Patient Stated Goals reduce knee pain, normalize gait   Currently in Pain? Yes   Pain Score 4   Rt knee 2/10, Lt knee 4/10   Pain Location Knee   Pain Orientation Left;Right   Pain Descriptors / Indicators Shooting;Dull   Pain Type Chronic pain   Pain Onset More than a month ago   Pain Frequency Constant   Aggravating Factors  sitting too long (>1 hour), walking too long, standing     Pain Relieving Factors Tramadol, massaging it   Effect of Pain on Daily Activities antalgic gait, weight shift to the Rt, pain with sitting at work            [facility] PT Assessment - 09/26/16 0001      Assessment   Medical Diagnosis bilateral knee pain   Referring Provider Dorothyann Peng, NP   Onset Date/Surgical Date 09/27/11   Next MD Visit none scheduled   Prior Therapy none     Precautions   Precautions None     Balance Screen   Has the patient fallen in the past 6 months No   Has the patient had a decrease in activity level because of a fear of falling?  No   Is the patient reluctant to leave their home because of a fear of falling?  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, elliptical, [employer]  Cognition   Overall Cognitive Status Within Functional Limits for tasks assessed     Observation/Other Assessments   Focus on Therapeutic Outcomes (FOTO)  43% limitation     Posture/Postural Control   Posture/Postural Control No significant limitations     ROM / Strength   AROM / PROM / Strength AROM;PROM;Strength     AROM   Overall AROM  Deficits   AROM Assessment Site Knee   Right/Left Knee Right;Left   Right Knee Extension 0   Right Knee Flexion 115   Left Knee Extension 0   Left Knee Flexion 110  pain     PROM   Overall PROM  Deficits   Overall PROM Comments see above     Strength   Overall Strength Deficits   Overall Strength Comments bil hip strength 4 to 4+/5 throughout, fair quad set on the Lt   Strength Assessment Site Knee   Right/Left Knee Right;Left   Right Knee Flexion 4+/5   Right Knee Extension 4/5   Left Knee Flexion 4/5   Left Knee Extension 4-/5  with quad lag     Palpation    Patella mobility crepitus   Palpation comment diffuse palpable tenderness at distal Lt quad and lateral joint line     Transfers   Transfers Sit to Stand;Stand to Sit   Stand to Sit With upper extremity assist     Ambulation/Gait   Ambulation/Gait Yes   Ambulation/Gait Assistance 6: Modified independent (Device/Increase time)   Ambulation Distance (Feet) 100 Feet   Gait Pattern Step-through pattern;Decreased arm swing - right;Decreased arm swing - left;Decreased step length - left;Decreased stance time - left   Gait Comments antalgic gait upon standing with decreased time spent in stance on Lt and increased trunk sway                             PT Education - 09/26/16 1558    Education provided Yes   Education Details level 1 knee strength   Person(s) Educated Patient   Methods Explanation;Handout;Tactile cues   Comprehension Verbalized understanding;Returned demonstration          PT Short Term Goals - 09/26/16 1618      PT SHORT TERM GOAL #1   Title be independent in initial HEP   Time 4   Period Weeks   Status New     PT SHORT TERM GOAL #2   Title report a 25% reduction in knee pain after sitting at work   Time 4   Period Weeks   Status New     PT SHORT TERM GOAL #3   Title stand with Rt=Lt weightbearing at least 50% of the time   Time 4   Period Weeks   Status New           PT Long Term Goals - 09/26/16 1529      PT LONG TERM GOAL #1   Title be independent in advanced HEP   Time 8   Period Weeks   Status New     PT LONG TERM GOAL #2   Title reduce FOTO to < or = to 35% limitation   Time 8   Period Weeks   Status New     PT LONG TERM GOAL #3   Title report a 50% reduction in bil. knee pain with standing and walking   Time 8   Period Weeks   Status New  PT LONG TERM GOAL #4   Title demonstrate symmetry with ambulation upon standing at least 50% of the time   Time 8   Period Weeks   Status New     PT LONG TERM  GOAL #5   Title stand with Rt=Lt weightbearing at least 75% of the time   Time 8   Period Weeks   Status New               Plan - 09/26/16 1613    Clinical Impression Statement Pt presents to PT with compliants of Lt>Rt knee pain of a chronic nature. Pt rates pain as 2-4/10 today and pain varies based on level of activity.  Pt demonstrates bil. LE weakness, angalgic gait, reduced Lt knee flexibility and fair quad set on the Lt.  Pt will benefit from skilled PT for LE strength, flexiblility, gait training and modalities for pain control.     Rehab Potential Good   PT Frequency 2x / week   PT Duration 8 weeks   PT Treatment/Interventions ADLs/Self Care Home Management;Cryotherapy;Electrical Stimulation;Functional mobility training;Ultrasound;Moist Heat;Therapeutic activities;Therapeutic exercise;Balance training;Neuromuscular re-education;Patient/family education;Passive range of motion;Manual techniques;Dry needling;Taping   PT Next Visit Plan Quad and hamstring strength, knee flexibility, hip strength, modalities for pain   Consulted and Agree with Plan of Care Patient      Patient will benefit from skilled therapeutic intervention in order to improve the following deficits and impairments:  Abnormal gait, Decreased range of motion, Difficulty walking, Pain, Decreased strength, Impaired flexibility, Decreased activity tolerance, Decreased endurance  Visit Diagnosis: Chronic pain of left knee  Chronic pain of right knee  Muscle weakness (generalized)  Other abnormalities of gait and mobility     Problem List Patient Active Problem List   Diagnosis Date Noted  . Hypothyroidism 06/23/2015  . Essential hypertension 06/23/2015     Sigurd Sos, PT 09/26/16 4:23 PM  [facility] [address] [place], [place], [address] Phone: [phone]   Fax:  [phone]  Name: Kenyata Sayward MRN: [id number] Date of  Birth: Apr 06, 1966

## 2016-09-26 NOTE — Patient Instructions (Addendum)
Knee Extension: Short Arc (Eccentric) - Supine or Sitting   Lie on back with roll under knee. Extend knee. Slowly lower foot for 3-5 seconds. _10__ reps per set, _3 _ sets per day, _7__ days per week.   Copyright  VHI. All rights reserved.  Quad Set   With other leg bent, foot flat, slowly tighten muscles on thigh of straight leg while counting out loud to _5___. Repeat 10-20___ times. Do _3 times a day_  Hip Flexion / Knee Extension: Straight-Leg Raise (Eccentric)   Lie on back. Lift leg with knee straight. Slowly lower leg for 3-5 seconds. __2x5  reps per set,  3__ sets per day, _7__ days per week.Heel Slides   Use a strap or towel to Slide left heel along bed towards bottom. Hold for _5-10__ seconds. Perform gently. Slide back to flat knee position. Repeat 10___ times. Do _3__ times a day.     [facility] [address] [place], [place] [address] Phone # [phone] Fax [phone]

## 2016-09-28 ENCOUNTER — Encounter: Payer: Self-pay | Admitting: Adult Health

## 2016-09-29 ENCOUNTER — Telehealth: Payer: Self-pay | Admitting: Physical Therapy

## 2016-09-29 ENCOUNTER — Ambulatory Visit: Payer: BLUE CROSS/BLUE SHIELD | Admitting: Physical Therapy

## 2016-09-29 NOTE — Telephone Encounter (Signed)
Pt did not show up for therapy. Therapist called and left message for patient. Mikle Bosworth, PTA 09/29/16 4:32 PM

## 2016-10-04 ENCOUNTER — Ambulatory Visit: Payer: BLUE CROSS/BLUE SHIELD | Admitting: Physical Therapy

## 2016-10-04 ENCOUNTER — Encounter: Payer: Self-pay | Admitting: Physical Therapy

## 2016-10-04 DIAGNOSIS — M25561 Pain in right knee: Secondary | ICD-10-CM

## 2016-10-04 DIAGNOSIS — M25562 Pain in left knee: Principal | ICD-10-CM

## 2016-10-04 DIAGNOSIS — G8929 Other chronic pain: Secondary | ICD-10-CM

## 2016-10-04 DIAGNOSIS — M6281 Muscle weakness (generalized): Secondary | ICD-10-CM

## 2016-10-04 DIAGNOSIS — R2689 Other abnormalities of gait and mobility: Secondary | ICD-10-CM

## 2016-10-04 NOTE — Therapy (Signed)
[facility] [address] [place], [place], [address] Phone: [phone]   Fax:  [phone]  Physical Therapy Treatment  Patient Details  Name: Cheryl Stone MRN: [id number] Date of Birth: 1966/04/03 Referring Provider: Dorothyann Peng, NP  Encounter Date: 10/04/2016      PT End of Session - 10/04/16 1536    Visit Number 2   Date for PT Re-Evaluation 11/21/16   PT Start Time [id number]   PT Stop Time 1630   PT Time Calculation (min) 56 min   Activity Tolerance Patient tolerated treatment well   Behavior During Therapy [facility] for tasks assessed/performed      Past Medical History:  Diagnosis Date  . Acid reflux   . Back pain   . Fluid retention   . Gallstones   . Graves disease    1999 had iodine radiation  . Graves disease    RAI ablation   . Hypertension   . Hypothyroidism   . Morbid obesity ([place])   . Osteoarthritis     Past Surgical History:  Procedure Laterality Date  . CHOLECYSTECTOMY    . TUBAL LIGATION    . Uterine Ablation      There were no vitals filed for this visit.      Subjective Assessment - 10/04/16 1535    Subjective Pt presents to PT with chronic history of Lt>Rt knee pain.  No incident or injury.     Pertinent History Pt reports that MD did cortizone injections into knees    Limitations Walking;Sitting;Standing   How long can you sit comfortably? 1 hour at work   How long can you walk comfortably? pain after doing this for long periods, not limit at the time.     Diagnostic tests none recent   Patient Stated Goals reduce knee pain, normalize gait   Currently in Pain? Yes   Pain Score 7    Pain Location Knee   Pain Orientation Right;Left   Pain Descriptors / Indicators Shooting;Dull   Pain Onset More than a month ago   Pain Frequency Constant                         [facility] Adult PT Treatment/Exercise - 10/04/16 0001      Exercises   Exercises Lumbar;Knee/Hip      Lumbar Exercises: Stretches   Active Hamstring Stretch 2 reps;10 seconds     Lumbar Exercises: Seated   Long Arc Quad on Chair Strengthening;Both;2 sets;10 reps  #3     Lumbar Exercises: Supine   Straight Leg Raise 10 reps  #3     Knee/Hip Exercises: Aerobic   Nustep L1 6 minutes     Knee/Hip Exercises: Seated   Hamstring Curl Both;2 sets;10 reps  Red tband     Modalities   Modalities Moist Heat;Electrical Stimulation     Moist Heat Therapy   Number Minutes Moist Heat 15 Minutes   Moist Heat Location Knee     Electrical Stimulation   Electrical Stimulation Location R knee   Electrical Stimulation Action IFC   Electrical Stimulation Parameters intensity 17   Electrical Stimulation Goals Pain     Manual Therapy   Manual Therapy Joint mobilization   Manual therapy comments Pt long sitting with knee on rolled towel   Joint Mobilization Patellar mobilization                  PT Short Term Goals - 10/04/16  [place] #1   Title be independent in initial HEP   Time 4   Period Weeks   Status On-going     PT SHORT TERM GOAL #2   Title report a 25% reduction in knee pain after sitting at work   Time 4   Period Weeks   Status On-going     PT SHORT TERM GOAL #3   Title stand with Rt=Lt weightbearing at least 50% of the time   Time 4   Period Weeks   Status On-going           PT Long Term Goals - 10/04/16 1537      PT LONG TERM GOAL #1   Title be independent in advanced HEP   Time 8   Period Weeks   Status On-going     PT LONG TERM GOAL #2   Title reduce FOTO to < or = to 35% limitation   Time 8   Period Weeks   Status On-going     PT LONG TERM GOAL #3   Title report a 50% reduction in bil. knee pain with standing and walking   Time 8   Period Weeks   Status On-going     PT LONG TERM GOAL #4   Title demonstrate symmetry with ambulation upon standing at least 50% of the time   Time 8   Period Weeks   Status  On-going     PT LONG TERM GOAL #5   Title stand with Rt=Lt weightbearing at least 75% of the time   Time 8   Period Weeks   Status On-going               Plan - 10/04/16 1641    Clinical Impression Statement Pt reports having 7/10 knee pain. Able to tolerate all exercises well needing some verbal cues for abdonminal bracing and technique. Pt will continue to benefit from skilled therapy for knee strenght, stability, and pain management.    Rehab Potential Good   PT Frequency 2x / week   PT Duration 8 weeks   PT Treatment/Interventions ADLs/Self Care Home Management;Cryotherapy;Electrical Stimulation;Functional mobility training;Ultrasound;Moist Heat;Therapeutic activities;Therapeutic exercise;Balance training;Neuromuscular re-education;Patient/family education;Passive range of motion;Manual techniques;Dry needling;Taping   PT Next Visit Plan Quad and hamstring strength, knee flexibility, hip strength, modalities for pain   Consulted and Agree with Plan of Care Patient      Patient will benefit from skilled therapeutic intervention in order to improve the following deficits and impairments:  Abnormal gait, Decreased range of motion, Difficulty walking, Pain, Decreased strength, Impaired flexibility, Decreased activity tolerance, Decreased endurance  Visit Diagnosis: Chronic pain of left knee  Chronic pain of right knee  Muscle weakness (generalized)  Other abnormalities of gait and mobility     Problem List Patient Active Problem List   Diagnosis Date Noted  . Hypothyroidism 06/23/2015  . Essential hypertension 06/23/2015    Mikle Bosworth PTA 10/04/2016, 4:45 PM  [facility] [address] [place], [place], [address] Phone: [phone]   Fax:  [phone]  Name: Cheryl Stone MRN: [id number] Date of Birth: Dec 15, 1965

## 2016-10-06 ENCOUNTER — Encounter: Payer: Self-pay | Admitting: Physical Therapy

## 2016-10-06 ENCOUNTER — Ambulatory Visit: Payer: BLUE CROSS/BLUE SHIELD | Attending: Adult Health | Admitting: Physical Therapy

## 2016-10-06 DIAGNOSIS — M25562 Pain in left knee: Secondary | ICD-10-CM | POA: Diagnosis not present

## 2016-10-06 DIAGNOSIS — R2689 Other abnormalities of gait and mobility: Secondary | ICD-10-CM | POA: Insufficient documentation

## 2016-10-06 DIAGNOSIS — M25561 Pain in right knee: Secondary | ICD-10-CM | POA: Insufficient documentation

## 2016-10-06 DIAGNOSIS — G8929 Other chronic pain: Secondary | ICD-10-CM | POA: Insufficient documentation

## 2016-10-06 DIAGNOSIS — M6281 Muscle weakness (generalized): Secondary | ICD-10-CM

## 2016-10-06 NOTE — Therapy (Signed)
[facility] [address] [place], [place], [address] Phone: [phone]   Fax:  [phone]  Physical Therapy Treatment  Patient Details  Name: Cheryl Stone MRN: [id number] Date of Birth: July 11, 1966 Referring Provider: Dorothyann Peng, NP  Encounter Date: 10/06/2016      PT End of Session - 10/06/16 1528    Visit Number 3   Date for PT Re-Evaluation 11/21/16   PT Start Time 1526   PT Stop Time 1626   PT Time Calculation (min) 60 min   Activity Tolerance Patient tolerated treatment well   Behavior During Therapy [facility] for tasks assessed/performed      Past Medical History:  Diagnosis Date  . Acid reflux   . Back pain   . Fluid retention   . Gallstones   . Graves disease    1999 had iodine radiation  . Graves disease    RAI ablation   . Hypertension   . Hypothyroidism   . Morbid obesity ([place])   . Osteoarthritis     Past Surgical History:  Procedure Laterality Date  . CHOLECYSTECTOMY    . TUBAL LIGATION    . Uterine Ablation      There were no vitals filed for this visit.      Subjective Assessment - 10/06/16 1526    Subjective Pt reports having 6/10 pain this morning but feeling better now.    Pertinent History Pt reports that MD did cortizone injections into knees    Limitations Walking;Sitting;Standing   How long can you sit comfortably? 1 hour at work   How long can you walk comfortably? pain after doing this for long periods, not limit at the time.     Diagnostic tests none recent   Patient Stated Goals reduce knee pain, normalize gait   Currently in Pain? Yes   Pain Score 2    Pain Location Knee   Pain Orientation Right;Left   Pain Descriptors / Indicators Dull;Shooting   Pain Type Chronic pain                         [facility] Adult PT Treatment/Exercise - 10/06/16 0001      Lumbar Exercises: Seated   Long Arc Quad on Chair Strengthening;Both;2 sets;10 reps  #3     Lumbar Exercises: Supine   Straight Leg Raise 10 reps  #3     Lumbar Exercises: Sidelying   Hip Abduction 10 reps     Knee/Hip Exercises: Stretches   Passive Hamstring Stretch Left;2 reps   Gastroc Stretch Both;2 reps;10 seconds     Knee/Hip Exercises: Aerobic   Nustep L1 8 minutes     Knee/Hip Exercises: Standing   Lateral Step Up Both;2 sets;10 reps;Hand Hold: 1;Step Height: 4"     Knee/Hip Exercises: Seated   Hamstring Curl Both;2 sets;10 reps  Red tband     Modalities   Modalities Moist Heat;Electrical Stimulation     Moist Heat Therapy   Number Minutes Moist Heat 15 Minutes   Moist Heat Location Knee     Electrical Stimulation   Electrical Stimulation Location Rt knee   Electrical Stimulation Action IFC   Electrical Stimulation Parameters To tolerance   Electrical Stimulation Goals Pain                  PT Short Term Goals - 10/04/16 1536      PT SHORT TERM GOAL #1   Title be independent in  initial HEP   Time 4   Period Weeks   Status On-going     PT SHORT TERM GOAL #2   Title report a 25% reduction in knee pain after sitting at work   Time 4   Period Weeks   Status On-going     PT SHORT TERM GOAL #3   Title stand with Rt=Lt weightbearing at least 50% of the time   Time 4   Period Weeks   Status On-going           PT Long Term Goals - 10/04/16 1537      PT LONG TERM GOAL #1   Title be independent in advanced HEP   Time 8   Period Weeks   Status On-going     PT LONG TERM GOAL #2   Title reduce FOTO to < or = to 35% limitation   Time 8   Period Weeks   Status On-going     PT LONG TERM GOAL #3   Title report a 50% reduction in bil. knee pain with standing and walking   Time 8   Period Weeks   Status On-going     PT LONG TERM GOAL #4   Title demonstrate symmetry with ambulation upon standing at least 50% of the time   Time 8   Period Weeks   Status On-going     PT LONG TERM GOAL #5   Title stand with Rt=Lt  weightbearing at least 75% of the time   Time 8   Period Weeks   Status On-going               Plan - 10/06/16 1617    Clinical Impression Statement Pt able to tolerate all strengthening exericses well with some muscle fatigue. Pt reports feeling good after last session but had some increased pain this morning.  Pt will continue to benefit from skilled therapy for Bil knee strenghtening.    Rehab Potential Good   PT Frequency 2x / week   PT Duration 8 weeks   PT Treatment/Interventions ADLs/Self Care Home Management;Cryotherapy;Electrical Stimulation;Functional mobility training;Ultrasound;Moist Heat;Therapeutic activities;Therapeutic exercise;Balance training;Neuromuscular re-education;Patient/family education;Passive range of motion;Manual techniques;Dry needling;Taping   PT Next Visit Plan Quad and hamstring strength, knee flexibility, hip strength, modalities for pain   Consulted and Agree with Plan of Care Patient      Patient will benefit from skilled therapeutic intervention in order to improve the following deficits and impairments:  Abnormal gait, Decreased range of motion, Difficulty walking, Pain, Decreased strength, Impaired flexibility, Decreased activity tolerance, Decreased endurance  Visit Diagnosis: Chronic pain of left knee  Chronic pain of right knee  Muscle weakness (generalized)  Other abnormalities of gait and mobility     Problem List Patient Active Problem List   Diagnosis Date Noted  . Hypothyroidism 06/23/2015  . Essential hypertension 06/23/2015    Mikle Bosworth PTA 10/06/2016, 4:28 PM  [facility] [address] [place], [place], [address] Phone: [phone]   Fax:  [phone]  Name: Cheryl Stone MRN: [id number] Date of Birth: [birth year]

## 2016-10-10 ENCOUNTER — Encounter: Payer: BLUE CROSS/BLUE SHIELD | Admitting: Physical Therapy

## 2016-10-13 ENCOUNTER — Ambulatory Visit: Payer: BLUE CROSS/BLUE SHIELD | Admitting: Physical Therapy

## 2016-10-13 ENCOUNTER — Encounter: Payer: Self-pay | Admitting: Physical Therapy

## 2016-10-13 DIAGNOSIS — M25562 Pain in left knee: Principal | ICD-10-CM

## 2016-10-13 DIAGNOSIS — M6281 Muscle weakness (generalized): Secondary | ICD-10-CM

## 2016-10-13 DIAGNOSIS — R2689 Other abnormalities of gait and mobility: Secondary | ICD-10-CM

## 2016-10-13 DIAGNOSIS — G8929 Other chronic pain: Secondary | ICD-10-CM

## 2016-10-13 DIAGNOSIS — M25561 Pain in right knee: Secondary | ICD-10-CM

## 2016-10-13 NOTE — Therapy (Signed)
[facility] [address] [place], [place], [address] Phone: [phone]   Fax:  [phone]  Physical Therapy Treatment  Patient Details  Name: Cheryl Stone MRN: [id number] Date of Birth: 1966/02/10 Referring Provider: Dorothyann Peng, NP  Encounter Date: 10/13/2016      PT End of Session - 10/13/16 1453    Visit Number 4   Date for PT Re-Evaluation 11/21/16   PT Start Time 1446   PT Stop Time 1550   PT Time Calculation (min) 64 min   Activity Tolerance Patient tolerated treatment well   Behavior During Therapy [facility] for tasks assessed/performed      Past Medical History:  Diagnosis Date  . Acid reflux   . Back pain   . Fluid retention   . Gallstones   . Graves disease    1999 had iodine radiation  . Graves disease    RAI ablation   . Hypertension   . Hypothyroidism   . Morbid obesity ([place])   . Osteoarthritis     Past Surgical History:  Procedure Laterality Date  . CHOLECYSTECTOMY    . TUBAL LIGATION    . Uterine Ablation      There were no vitals filed for this visit.      Subjective Assessment - 10/13/16 1450    Subjective Pt reports less pain today. Did some walking this week which increased pain to 9/10.   Pertinent History Pt reports that MD did cortizone injections into knees    Limitations Walking;Sitting;Standing   How long can you sit comfortably? 1 hour at work   How long can you walk comfortably? pain after doing this for long periods, not limit at the time.     Diagnostic tests none recent   Patient Stated Goals reduce knee pain, normalize gait   Currently in Pain? Yes   Pain Score 3    Pain Location Knee   Pain Orientation Right;Left   Pain Descriptors / Indicators Shooting   Pain Type Chronic pain                         [facility] Adult PT Treatment/Exercise - 10/13/16 0001      Lumbar Exercises: Seated   Long Arc Quad on Chair Strengthening;Both;2 sets;10  reps  #3     Lumbar Exercises: Supine   Bridge 20 reps   Straight Leg Raise 10 reps  Increased patellar tendon pain today with SLR     Lumbar Exercises: Sidelying   Hip Abduction 10 reps     Knee/Hip Exercises: Aerobic   Nustep L1 8 minutes     Knee/Hip Exercises: Seated   Hamstring Curl Both;2 sets;10 reps  Red tband     Knee/Hip Exercises: Supine   Other Supine Knee/Hip Exercises --     Modalities   Modalities Moist Heat;Electrical Stimulation     Moist Heat Therapy   Number Minutes Moist Heat 15 Minutes   Moist Heat Location Knee     Electrical Stimulation   Electrical Stimulation Location Rt knee   Electrical Stimulation Action IFC   Electrical Stimulation Parameters to tolerance   Electrical Stimulation Goals Pain     Manual Therapy   Manual Therapy Joint mobilization;Taping   Manual therapy comments Pt long sitting with knee on rolled towel, stretch applied and stabillity to patellar tendon   Joint Mobilization Patellar mobilization  PT Short Term Goals - 10/13/16 1454      PT SHORT TERM GOAL #1   Title be independent in initial HEP   Time 4   Period Weeks   Status On-going     PT SHORT TERM GOAL #2   Title report a 25% reduction in knee pain after sitting at work   Time 4   Period Weeks   Status On-going     PT SHORT TERM GOAL #3   Title stand with Rt=Lt weightbearing at least 50% of the time   Time 4   Period Weeks   Status On-going           PT Long Term Goals - 10/13/16 1454      PT LONG TERM GOAL #1   Title be independent in advanced HEP   Time 8   Period Weeks   Status On-going     PT LONG TERM GOAL #2   Title reduce FOTO to < or = to 35% limitation   Time 8   Period Weeks   Status On-going     PT LONG TERM GOAL #3   Title report a 50% reduction in bil. knee pain with standing and walking   Time 8   Period Weeks   Status On-going     PT LONG TERM GOAL #4   Title demonstrate symmetry with  ambulation upon standing at least 50% of the time   Time 8   Period Weeks   Status On-going     PT LONG TERM GOAL #5   Title stand with Rt=Lt weightbearing at least 75% of the time   Time 8   Period Weeks   Status On-going               Plan - 10/13/16 1539    Clinical Impression Statement Pt having increased patellar tendon pain today. Pt has difficulty with knee flexion due to pain and limited today with knee extension due to pain. Palpable tenderness in patellar tendon. Pt will contiue to benefir from skilled therapy for strengthening and stabilization of Bil LE.    Rehab Potential Good   PT Frequency 2x / week   PT Duration 8 weeks   PT Treatment/Interventions ADLs/Self Care Home Management;Cryotherapy;Electrical Stimulation;Functional mobility training;Ultrasound;Moist Heat;Therapeutic activities;Therapeutic exercise;Balance training;Neuromuscular re-education;Patient/family education;Passive range of motion;Manual techniques;Dry needling;Taping   PT Next Visit Plan Quad and hamstring strength, knee flexibility, hip strength, modalities for pain   Consulted and Agree with Plan of Care Patient      Patient will benefit from skilled therapeutic intervention in order to improve the following deficits and impairments:  Abnormal gait, Decreased range of motion, Difficulty walking, Pain, Decreased strength, Impaired flexibility, Decreased activity tolerance, Decreased endurance  Visit Diagnosis: Chronic pain of left knee  Chronic pain of right knee  Muscle weakness (generalized)  Other abnormalities of gait and mobility     Problem List Patient Active Problem List   Diagnosis Date Noted  . Hypothyroidism 06/23/2015  . Essential hypertension 06/23/2015    Cheryl Stone PTA 10/13/2016, 4:00 PM  [place] [facility] [address] [place], [place], [address] Phone: [phone]   Fax:  [phone]  Name:  Cheryl Stone MRN: [id number] Date of Birth: [birth year]

## 2016-10-17 ENCOUNTER — Ambulatory Visit: Payer: BLUE CROSS/BLUE SHIELD

## 2016-10-17 DIAGNOSIS — R2689 Other abnormalities of gait and mobility: Secondary | ICD-10-CM

## 2016-10-17 DIAGNOSIS — G8929 Other chronic pain: Secondary | ICD-10-CM

## 2016-10-17 DIAGNOSIS — M25562 Pain in left knee: Secondary | ICD-10-CM | POA: Diagnosis not present

## 2016-10-17 DIAGNOSIS — M6281 Muscle weakness (generalized): Secondary | ICD-10-CM

## 2016-10-17 DIAGNOSIS — M25561 Pain in right knee: Secondary | ICD-10-CM

## 2016-10-17 NOTE — Therapy (Signed)
[facility] [address], [place], [place], [address] Phone: [phone]   Fax:  [phone]  Physical Therapy Treatment  Patient Details  Name: Cheryl Stone MRN: [id number] Date of Birth: 1966/01/06 Referring Provider: Dorothyann Peng, NP  Encounter Date: 10/17/2016      PT End of Session - 10/17/16 1607    Visit Number 5   Date for PT Re-Evaluation 11/21/16   PT Start Time 1532   PT Stop Time 1625   PT Time Calculation (min) 53 min   Activity Tolerance Patient tolerated treatment well   Behavior During Therapy [facility] for tasks assessed/performed      Past Medical History:  Diagnosis Date  . Acid reflux   . Back pain   . Fluid retention   . Gallstones   . Graves disease    1999 had iodine radiation  . Graves disease    RAI ablation   . Hypertension   . Hypothyroidism   . Morbid obesity ([place])   . Osteoarthritis     Past Surgical History:  Procedure Laterality Date  . CHOLECYSTECTOMY    . TUBAL LIGATION    . Uterine Ablation      There were no vitals filed for this visit.      Subjective Assessment - 10/17/16 1542    Subjective Pt reports that she is feeling a little bit better.  Knees are still painful with sitting and walking long periods.     Currently in Pain? Yes   Pain Score 4    Pain Location Knee   Pain Orientation Right;Left   Pain Descriptors / Indicators Shooting   Pain Type Chronic pain   Pain Onset More than a month ago   Pain Frequency Constant   Aggravating Factors  sitting too long (> 1 hour), walking too long, standing   Pain Relieving Factors Tramadol, massaging it, heat            [facility] PT Assessment - 10/17/16 0001      Assessment   Medical Diagnosis bilateral knee pain   Onset Date/Surgical Date 09/27/11   Next MD Visit none scheduled     [place] residence   Type of [place] Access Level entry   Home Layout One  level     Prior Function   Level of Independence Independent   Vocation Part time employment   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, elliptical, [employer]      Cognition   Overall Cognitive Status Within Functional Limits for tasks assessed     Observation/Other Assessments   Focus on Therapeutic Outcomes (FOTO)  43% limitation     AROM   Overall AROM  Deficits   Right/Left Knee Right;Left   Right Knee Extension 0   Right Knee Flexion 115   Left Knee Extension 0   Left Knee Flexion 110     Strength   Overall Strength Deficits   Overall Strength Comments bil hip strength 4 to 4+/5 throughout, fair quad set on the Lt   Right Knee Flexion 4+/5   Right Knee Extension 4/5   Left Knee Flexion 4/5   Left Knee Extension 4-/5     Palpation   Patella mobility crepitus   Palpation comment diffuse palpable tenderness at distal Lt quad and lateral joint line     Transfers   Transfers Sit to Stand;Stand to [employer]  to Sit With upper extremity assist     Ambulation/Gait   Ambulation/Gait Yes   Ambulation/Gait Assistance 6: Modified independent (Device/Increase time)   Ambulation Distance (Feet) 100 Feet   Gait Pattern Step-through pattern;Decreased arm swing - right;Decreased arm swing - left;Decreased step length - left;Decreased stance time - left   Gait Comments antalgic gait upon standing with decreased time spent in stance on Lt and increased trunk sway                     [facility] Adult PT Treatment/Exercise - 10/17/16 0001      Lumbar Exercises: Seated   Long Arc Quad on Chair Strengthening;Both;10 reps;3 sets   LAQ on Chair Weights (lbs) 3     Lumbar Exercises: Supine   Bridge 20 reps  ball squeeze     Lumbar Exercises: Sidelying   Clam 20 reps   Hip Abduction --     Knee/Hip Exercises: Aerobic   Recumbent Bike Level 2x 8 minutes  PT present to discuss progress     Knee/Hip Exercises: Standing   Heel Raises 2  sets;10 reps;Both   Rebounder weight shifting 3 ways: 1 minute each     Moist Heat Therapy   Number Minutes Moist Heat 15 Minutes   Moist Heat Location Knee     Electrical Stimulation   Electrical Stimulation Location Lt knee   Electrical Stimulation Action IFC   Electrical Stimulation Parameters 15 minutes   Electrical Stimulation Goals Pain                  PT Short Term Goals - 10/17/16 1537      PT SHORT TERM GOAL #1   Title be independent in initial HEP   Status Achieved     PT SHORT TERM GOAL #2   Title report a 25% reduction in knee pain after sitting at work   Time 4   Period Weeks   Status On-going     PT SHORT TERM GOAL #3   Title stand with Rt=Lt weightbearing at least 50% of the time   Time 4   Period Weeks   Status On-going           PT Long Term Goals - 10/13/16 1454      PT LONG TERM GOAL #1   Title be independent in advanced HEP   Time 8   Period Weeks   Status On-going     PT LONG TERM GOAL #2   Title reduce FOTO to < or = to 35% limitation   Time 8   Period Weeks   Status On-going     PT LONG TERM GOAL #3   Title report a 50% reduction in bil. knee pain with standing and walking   Time 8   Period Weeks   Status On-going     PT LONG TERM GOAL #4   Title demonstrate symmetry with ambulation upon standing at least 50% of the time   Time 8   Period Weeks   Status On-going     PT LONG TERM GOAL #5   Title stand with Rt=Lt weightbearing at least 75% of the time   Time 8   Period Weeks   Status On-going               Plan - 10/17/16 1538    Clinical Impression Statement Pt with chronic knee pain and weakness.  Pt continues to report bil knee pain with sitting long  periods and walking.  Gait is antalic upon standing.  Pt with palpable tenderness in bil. pateallar tendon.  Pt continues to weightbear into Rt LE when standing.  Pt will benefit from skilled PT for strength, endurance, gait and mobility.     Rehab  Potential Good   PT Frequency 2x / week   PT Duration 8 weeks   PT Treatment/Interventions ADLs/Self Care Home Management;Cryotherapy;Electrical Stimulation;Functional mobility training;Ultrasound;Moist Heat;Therapeutic activities;Therapeutic exercise;Balance training;Neuromuscular re-education;Patient/family education;Passive range of motion;Manual techniques;Dry needling;Taping   PT Next Visit Plan Quad and hamstring strength, knee flexibility, hip strength, modalities for pain   Consulted and Agree with Plan of Care Patient      Patient will benefit from skilled therapeutic intervention in order to improve the following deficits and impairments:  Abnormal gait, Decreased range of motion, Difficulty walking, Pain, Decreased strength, Impaired flexibility, Decreased activity tolerance, Decreased endurance  Visit Diagnosis: Chronic pain of left knee  Chronic pain of right knee  Muscle weakness (generalized)  Other abnormalities of gait and mobility     Problem List Patient Active Problem List   Diagnosis Date Noted  . Hypothyroidism 06/23/2015  . Essential hypertension 06/23/2015     Sigurd Sos, PT 10/17/16 4:12 PM  [facility] [address], [place], [place], [address] Phone: [phone]   Fax:  [phone]  Name: Cheryl Stone MRN: [id number] Date of Birth: 1966-08-08

## 2016-10-17 NOTE — Addendum Note (Signed)
Addended by: Danie Binder on: 10/17/2016 03:42 PM   Modules accepted: Orders

## 2016-10-18 ENCOUNTER — Encounter: Payer: Self-pay | Admitting: Physical Therapy

## 2016-10-18 ENCOUNTER — Ambulatory Visit: Payer: BLUE CROSS/BLUE SHIELD | Admitting: Physical Therapy

## 2016-10-18 DIAGNOSIS — M6281 Muscle weakness (generalized): Secondary | ICD-10-CM

## 2016-10-18 DIAGNOSIS — R2689 Other abnormalities of gait and mobility: Secondary | ICD-10-CM

## 2016-10-18 DIAGNOSIS — M25562 Pain in left knee: Principal | ICD-10-CM

## 2016-10-18 DIAGNOSIS — M25561 Pain in right knee: Secondary | ICD-10-CM

## 2016-10-18 DIAGNOSIS — G8929 Other chronic pain: Secondary | ICD-10-CM

## 2016-10-18 NOTE — Therapy (Addendum)
[facility] [address], [place], [place], [address] Phone: [phone]   Fax:  [phone]  Physical Therapy Treatment  Patient Details  Name: Cheryl Stone MRN: [id number] Date of Birth: 1966/05/10 Referring Provider: Dorothyann Peng, NP  Encounter Date: 10/18/2016      PT End of Session - 10/18/16 1540    Visit Number 6   Date for PT Re-Evaluation 11/21/16   PT Start Time 5284   PT Stop Time 1625   PT Time Calculation (min) 50 min   Activity Tolerance Patient tolerated treatment well   Behavior During Therapy [facility] for tasks assessed/performed      Past Medical History:  Diagnosis Date  . Acid reflux   . Back pain   . Fluid retention   . Gallstones   . Graves disease    1999 had iodine radiation  . Graves disease    RAI ablation   . Hypertension   . Hypothyroidism   . Morbid obesity ([place])   . Osteoarthritis     Past Surgical History:  Procedure Laterality Date  . CHOLECYSTECTOMY    . TUBAL LIGATION    . Uterine Ablation      There were no vitals filed for this visit.      Subjective Assessment - 10/18/16 1539    Subjective Pt reports knee pain not too bad today. Reports she feels like she is getting better.    Pertinent History Pt reports that MD did cortizone injections into knees    Limitations Walking;Sitting;Standing   How long can you sit comfortably? 1 hour at work   How long can you walk comfortably? pain after doing this for long periods, not limit at the time.     Diagnostic tests none recent   Patient Stated Goals reduce knee pain, normalize gait   Currently in Pain? Yes   Pain Score 2    Pain Location Knee   Pain Orientation Right;Left   Pain Descriptors / Indicators Shooting   Pain Type Chronic pain                         [facility] Adult PT Treatment/Exercise - 10/18/16 0001      Knee/Hip Exercises: Stretches   Active Hamstring Stretch Both;2 reps;10  seconds   Gastroc Stretch 2 reps;10 seconds;Both     Knee/Hip Exercises: Aerobic   Nustep L3 x 8 minutes  Therapist present to discuss treatment     Knee/Hip Exercises: Machines for Strengthening   Cybex Knee Flexion #20 2x10 Bil, #15 2x10 single leg   Cybex Leg Press #90 Bil; #40 single leg  Seat 7     Knee/Hip Exercises: Standing   Heel Raises --   Hip Abduction Stengthening;Both;2 sets;10 reps  Verbal cues for no hyperextension   Hip Extension Stengthening;Both;2 sets;10 reps  VC for no hyperextension   Rebounder weight shifting 3 ways: 1 minute each     Modalities   Modalities Cryotherapy     Cryotherapy   Number Minutes Cryotherapy 15 Minutes   Cryotherapy Location Knee   Type of Cryotherapy Ice pack     Electrical Stimulation   Electrical Stimulation Location Lt knee   Electrical Stimulation Action IFC   Electrical Stimulation Parameters 15 minutes   Electrical Stimulation Goals Pain                  PT Short Term Goals - 10/17/16 1537  PT SHORT TERM GOAL #1   Title be independent in initial HEP   Status Achieved     PT SHORT TERM GOAL #2   Title report a 25% reduction in knee pain after sitting at work   Time 4   Period Weeks   Status On-going     PT SHORT TERM GOAL #3   Title stand with Rt=Lt weightbearing at least 50% of the time   Time 4   Period Weeks   Status On-going           PT Long Term Goals - 10/13/16 1454      PT LONG TERM GOAL #1   Title be independent in advanced HEP   Time 8   Period Weeks   Status On-going     PT LONG TERM GOAL #2   Title reduce FOTO to < or = to 35% limitation   Time 8   Period Weeks   Status On-going     PT LONG TERM GOAL #3   Title report a 50% reduction in bil. knee pain with standing and walking   Time 8   Period Weeks   Status On-going     PT LONG TERM GOAL #4   Title demonstrate symmetry with ambulation upon standing at least 50% of the time   Time 8   Period Weeks   Status  On-going     PT LONG TERM GOAL #5   Title stand with Rt=Lt weightbearing at least 75% of the time   Time 8   Period Weeks   Status On-going               Plan - 10/18/16 1606    Clinical Impression Statement Pt reports knees feeling better, continues to walk with antalgic gait. Able to tolerate all exercises well  but having some increased pain with single leg standing exercises. Pt tends to hyper extend knees. Pt will continue to benefit from skilled therapy for LE strenghtening and Bil knee stability.    Rehab Potential Good   PT Frequency 2x / week   PT Duration 8 weeks   PT Treatment/Interventions ADLs/Self Care Home Management;Cryotherapy;Electrical Stimulation;Functional mobility training;Ultrasound;Moist Heat;Therapeutic activities;Therapeutic exercise;Balance training;Neuromuscular re-education;Patient/family education;Passive range of motion;Manual techniques;Dry needling;Taping   PT Next Visit Plan Quad and hamstring strength, knee flexibility, hip strength, modalities for pain   Consulted and Agree with Plan of Care Patient      Patient will benefit from skilled therapeutic intervention in order to improve the following deficits and impairments:  Abnormal gait, Decreased range of motion, Difficulty walking, Pain, Decreased strength, Impaired flexibility, Decreased activity tolerance, Decreased endurance  Visit Diagnosis: Chronic pain of left knee  Chronic pain of right knee  Muscle weakness (generalized)  Other abnormalities of gait and mobility     Problem List Patient Active Problem List   Diagnosis Date Noted  . Hypothyroidism 06/23/2015  . Essential hypertension 06/23/2015    Mikle Bosworth PTA 10/18/2016, 5:15 PM PHYSICAL THERAPY DISCHARGE SUMMARY  Visits from Start of Care: 6  Current functional level related to goals / functional outcomes: Pt didn't return to PT.  See above for current status.   Remaining deficits: See above for most  current status.     Education / Equipment: HEP Plan: Patient agrees to discharge.  Patient goals were not met. Patient is being discharged due to not returning since the last visit.  ?????        Sigurd Sos, PT 11/17/16 8:32  AM  [facility] [address] [place], [place], [address] Phone: [phone]   Fax:  [phone]  Name: Cheryl Stone MRN: [id number] Date of Birth: 08-15-1966

## 2016-10-19 ENCOUNTER — Other Ambulatory Visit: Payer: Self-pay | Admitting: Adult Health

## 2016-10-19 DIAGNOSIS — Z760 Encounter for issue of repeat prescription: Secondary | ICD-10-CM

## 2016-10-20 ENCOUNTER — Encounter: Payer: BLUE CROSS/BLUE SHIELD | Admitting: Physical Therapy

## 2016-10-20 MED ORDER — LORCASERIN HCL 10 MG PO TABS: 10.0000 mg | ORAL_TABLET | Freq: Two times a day (BID) | ORAL | 0 refills | Status: DC

## 2016-10-25 ENCOUNTER — Ambulatory Visit: Payer: BLUE CROSS/BLUE SHIELD | Admitting: Physical Therapy

## 2016-11-02 ENCOUNTER — Other Ambulatory Visit: Payer: Self-pay | Admitting: Adult Health

## 2016-11-02 DIAGNOSIS — Z760 Encounter for issue of repeat prescription: Secondary | ICD-10-CM

## 2016-11-03 NOTE — Telephone Encounter (Signed)
She can have one additional month. Due to new laws,  If she feels like she needs to continue this medication then I am going to have to send her to orthopedics or pain management.

## 2016-11-03 NOTE — Telephone Encounter (Signed)
Ok to refill 

## 2016-11-04 MED ORDER — TRAMADOL HCL 50 MG PO TABS: 50.0000 mg | ORAL_TABLET | Freq: Three times a day (TID) | ORAL | 0 refills | Status: DC | PRN

## 2016-11-04 NOTE — Telephone Encounter (Signed)
Patient notified. Rx called in as directed.

## 2017-01-24 ENCOUNTER — Other Ambulatory Visit: Payer: Self-pay | Admitting: Adult Health

## 2017-01-25 ENCOUNTER — Encounter: Payer: Self-pay | Admitting: Adult Health

## 2017-01-26 ENCOUNTER — Other Ambulatory Visit: Payer: Self-pay

## 2017-01-26 MED ORDER — CHOLECALCIFEROL 125 MCG (5000 UT) PO TABS: 1.0000 | ORAL_TABLET | Freq: Every day | ORAL | 2 refills | Status: DC

## 2017-01-26 MED ORDER — FERROUS FUMARATE 325 (106 FE) MG PO TABS: 1.0000 | ORAL_TABLET | Freq: Every day | ORAL | 3 refills | Status: DC | PRN

## 2017-01-31 ENCOUNTER — Encounter: Payer: Self-pay | Admitting: Adult Health

## 2017-01-31 ENCOUNTER — Ambulatory Visit (INDEPENDENT_AMBULATORY_CARE_PROVIDER_SITE_OTHER): Payer: BLUE CROSS/BLUE SHIELD | Admitting: Adult Health

## 2017-01-31 VITALS — BP 118/60 | Temp 98.2°F | Ht 66.0 in | Wt 261.0 lb

## 2017-01-31 DIAGNOSIS — R600 Localized edema: Secondary | ICD-10-CM

## 2017-01-31 DIAGNOSIS — M791 Myalgia, unspecified site: Secondary | ICD-10-CM

## 2017-01-31 MED ORDER — TIZANIDINE HCL 4 MG PO TABS: 4.0000 mg | ORAL_TABLET | Freq: Four times a day (QID) | ORAL | 0 refills | Status: DC | PRN

## 2017-01-31 MED ORDER — HYDROCHLOROTHIAZIDE 25 MG PO TABS: ORAL_TABLET | ORAL | 0 refills | Status: DC
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
## 2017-02-01 ENCOUNTER — Ambulatory Visit: Payer: BLUE CROSS/BLUE SHIELD | Admitting: Adult Health

## 2017-02-03 ENCOUNTER — Ambulatory Visit (INDEPENDENT_AMBULATORY_CARE_PROVIDER_SITE_OTHER): Payer: BLUE CROSS/BLUE SHIELD | Admitting: Adult Health

## 2017-02-03 VITALS — HR 70 | Temp 98.9°F | Resp 16 | Wt 261.0 lb

## 2017-02-03 DIAGNOSIS — J069 Acute upper respiratory infection, unspecified: Secondary | ICD-10-CM

## 2017-02-03 MED ORDER — HYDROCODONE-HOMATROPINE 5-1.5 MG/5ML PO SYRP: 5.0000 mL | ORAL_SOLUTION | Freq: Three times a day (TID) | ORAL | 0 refills | Status: DC | PRN

## 2017-02-03 NOTE — Progress Notes (Addendum)
Subjective:    Patient ID: Cheryl Stone, female    DOB: [birth year], 51 y.o.   MRN: [id number]  HPI  51 year old female who  has a past medical history of Acid reflux; Back pain; Fluid retention; Gallstones; Graves disease; Graves disease; Hypertension; Hypothyroidism; Morbid obesity ([place]); and Osteoarthritis. She presents to the office today for follow up regarding URI. She reports that her symptoms have been presents for 2 days. She reports dry cough and subjective low grade fever with sinus pain and pressure. The coughing causes her to feel as though she is going to vomit. She a  She has been using Delsym which works well for her   Review of Systems See HPI   Past Medical History:  Diagnosis Date  . Acid reflux   . Back pain   . Fluid retention   . Gallstones   . Graves disease    1999 had iodine radiation  . Graves disease    RAI ablation   . Hypertension   . Hypothyroidism   . Morbid obesity ([place])   . Osteoarthritis     Social History   Social History  . Marital status: Single    Spouse name: N/A  . Number of children: N/A  . Years of education: N/A   Occupational History  . Not on file.   Social History Main Topics  . Smoking status: Never Smoker  . Smokeless tobacco: Never Used  . Alcohol use 0.0 oz/week     Comment: once every 2 months  . Drug use: No  . Sexual activity: Not on file   Other Topics Concern  . 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        Past Surgical History:  Procedure Laterality Date  . CHOLECYSTECTOMY    . TUBAL LIGATION    . Uterine Ablation      Family History  Problem Relation Age of Onset  . Sudden Cardiac Death Mother     Saw her sister get killed, which led to heart attack  . Hypertension Maternal Grandmother   . Alzheimer's disease Maternal Grandfather   . Thyroid disease Paternal Grandmother   . Colon cancer  Neg Hx     Allergies  Allergen Reactions  . Lobster [Shellfish Allergy] Rash    Lobster  . Tylenol [Acetaminophen] Hives and Swelling    Current Outpatient Prescriptions on File Prior to Visit  Medication Sig Dispense Refill  . amLODipine (NORVASC) 5 MG tablet TAKE 1 TABLET (5 MG TOTAL) BY MOUTH DAILY. (Patient not taking: Reported on 01/31/2017) 90 tablet 1  . aspirin EC 81 MG tablet Take 162 mg by mouth once. Reported on 05/31/2016    . Cholecalciferol (D-5000) 5000 units TABS Take 1 tablet (5,000 Units total) by mouth daily. Reported on 05/31/2016 90 tablet 2  . ferrous fumarate (HEMOCYTE - 106 MG FE) 325 (106 Fe) MG TABS tablet Take 1 tablet (106 mg of iron total) by mouth daily as needed (iron deficiency). 30 each 3  . hydrochlorothiazide (HYDRODIURIL) 25 MG tablet TAKE 1 TABLET (25 MG TOTAL) BY MOUTH DAILY. 90 tablet 0  . ibuprofen (ADVIL,MOTRIN) 800 MG tablet Take 1 tablet (800 mg total) by mouth 3 (three) times daily. (Patient not taking: Reported on 01/31/2017) 21 tablet 0  . levothyroxine (SYNTHROID) 175 MCG tablet 1 qd plus extra half  on Sundays 102 tablet 3  . tiZANidine (ZANAFLEX) 4 MG tablet Take 1 tablet (4 mg total) by mouth every 6 (six) hours as needed for muscle spasms. 30 tablet 0  . traMADol (ULTRAM) 50 MG tablet Take 1 tablet (50 mg total) by mouth every 8 (eight) hours as needed for severe pain. 90 tablet 0   No current facility-administered medications on file prior to visit.     Pulse 70   Temp 98.9 F (37.2 C)   Resp 16   Wt 261 lb (118.4 kg)   SpO2 97%   BMI 42.13 kg/m       Objective:   Physical Exam  Constitutional: She is oriented to person, place, and time. She appears well-developed and well-nourished. No distress.  HENT:  Head: Normocephalic and atraumatic.  Right Ear: External ear normal.  Left Ear: External ear normal.  Nose: Right sinus exhibits frontal sinus tenderness. Right sinus exhibits no maxillary sinus tenderness. Left sinus exhibits  frontal sinus tenderness. Left sinus exhibits no maxillary sinus tenderness.  Mouth/Throat: Oropharynx is clear and moist and mucous membranes are normal. No oropharyngeal exudate.  Eyes: Conjunctivae and EOM are normal. Pupils are equal, round, and reactive to light. Right eye exhibits no discharge. Left eye exhibits no discharge. No scleral icterus.  Neck: Normal range of motion. Neck supple. No JVD present. No tracheal deviation present. No thyromegaly present.  Cardiovascular: Normal rate, regular rhythm, normal heart sounds and intact distal pulses.  Exam reveals no gallop.   No murmur heard. Pulmonary/Chest: Effort normal and breath sounds normal. No stridor. No respiratory distress. She has no wheezes. She has no rales. She exhibits no tenderness.  Lymphadenopathy:    She has cervical adenopathy.  Neurological: She is alert and oriented to person, place, and time.  Skin: Skin is warm and dry. No rash noted. She is not diaphoretic. No erythema. No pallor.  Psychiatric: She has a normal mood and affect. Her behavior is normal. Thought content normal.  Nursing note and vitals reviewed.     Assessment & Plan:  1. Upper respiratory tract infection, unspecified type - Likely viral in nature.  - Add mucinex. Hycodan at night - will make you sleepy  - HYDROcodone-homatropine (HYCODAN) 5-1.5 MG/5ML syrup; Take 5 mLs by mouth every 8 (eight) hours as needed for cough.  Dispense: 120 mL; Refill: 0 - Add Flonase - Follow up if no improvement   Dorothyann Peng, NP

## 2017-02-22 ENCOUNTER — Ambulatory Visit (INDEPENDENT_AMBULATORY_CARE_PROVIDER_SITE_OTHER): Payer: BLUE CROSS/BLUE SHIELD | Admitting: Adult Health

## 2017-02-22 ENCOUNTER — Encounter: Payer: Self-pay | Admitting: Adult Health

## 2017-02-22 VITALS — BP 118/78 | Temp 98.6°F | Wt 260.9 lb

## 2017-02-22 DIAGNOSIS — Z043 Encounter for examination and observation following other accident: Secondary | ICD-10-CM | POA: Diagnosis not present

## 2017-02-22 DIAGNOSIS — Z041 Encounter for examination and observation following transport accident: Secondary | ICD-10-CM

## 2017-02-22 MED ORDER — METHYLPREDNISOLONE 4 MG PO TBPK: ORAL_TABLET | ORAL | 0 refills | Status: DC

## 2017-02-22 MED ORDER — IBUPROFEN 600 MG PO TABS: 600.0000 mg | ORAL_TABLET | Freq: Three times a day (TID) | ORAL | 0 refills | Status: DC | PRN
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
## 2017-03-06 ENCOUNTER — Encounter: Payer: Self-pay | Admitting: Adult Health

## 2017-03-07 ENCOUNTER — Other Ambulatory Visit: Payer: Self-pay | Admitting: Adult Health

## 2017-03-07 DIAGNOSIS — Z760 Encounter for issue of repeat prescription: Secondary | ICD-10-CM

## 2017-03-07 MED ORDER — LORCASERIN HCL 10 MG PO TABS: 10.0000 mg | ORAL_TABLET | Freq: Two times a day (BID) | ORAL | 0 refills | Status: DC

## 2017-03-15 ENCOUNTER — Other Ambulatory Visit: Payer: Self-pay | Admitting: Adult Health

## 2017-03-15 DIAGNOSIS — Z760 Encounter for issue of repeat prescription: Secondary | ICD-10-CM

## 2017-03-15 NOTE — Telephone Encounter (Signed)
Ok to refill 

## 2017-03-15 NOTE — Telephone Encounter (Signed)
I thought I sent this in on 03/07/2017. Can you check on this. If not sent in, then can refill for one month

## 2017-03-17 ENCOUNTER — Telehealth: Payer: Self-pay

## 2017-03-17 NOTE — Telephone Encounter (Signed)
PA for Belviq approved, form faxed back to pharmacy.

## 2017-03-23 ENCOUNTER — Ambulatory Visit: Payer: BLUE CROSS/BLUE SHIELD | Admitting: Endocrinology

## 2017-03-23 ENCOUNTER — Other Ambulatory Visit: Payer: BLUE CROSS/BLUE SHIELD

## 2017-03-23 ENCOUNTER — Other Ambulatory Visit (INDEPENDENT_AMBULATORY_CARE_PROVIDER_SITE_OTHER): Payer: BLUE CROSS/BLUE SHIELD

## 2017-03-23 DIAGNOSIS — E890 Postprocedural hypothyroidism: Secondary | ICD-10-CM

## 2017-03-23 LAB — TSH: TSH: 0.57 u[IU]/mL (ref 0.35–4.50)

## 2017-03-23 LAB — T4, FREE: FREE T4: 1.05 ng/dL (ref 0.60–1.60)

## 2017-04-11 ENCOUNTER — Encounter: Payer: Self-pay | Admitting: Endocrinology

## 2017-04-11 ENCOUNTER — Ambulatory Visit (INDEPENDENT_AMBULATORY_CARE_PROVIDER_SITE_OTHER): Payer: BLUE CROSS/BLUE SHIELD | Admitting: Endocrinology

## 2017-04-11 VITALS — BP 130/86 | HR 63 | Ht 65.5 in | Wt 260.6 lb

## 2017-04-11 DIAGNOSIS — E890 Postprocedural hypothyroidism: Secondary | ICD-10-CM

## 2017-04-11 NOTE — Progress Notes (Signed)
Patient ID: Cheryl Stone, female   DOB: 29-Dec-1965, 51 y.o.   MRN: [id number]            Reason for Appointment:  Hypothyroidism, follow-up visit    History of Present Illness:    Hypothyroidism was first diagnosed in 03/1998  At that time patient was having symptoms of  fatigue, cold sensitivity and weight loss. She was found to be hyperthyroid and she was also having significant difficulty swallowing. She was told that her thyroid was the size of a golf ball and she was having Graves' disease She was given radioactive iodine  Subsequently her labs showed that she was hypothyroid and she was started on thyroid supplements.  At that time she was feeling fairly well. She was followed regularly before she moved here and apparently her levothyroxine dose has been increased stepwise About 2 years ago she was taking 200 g and subsequently was on 224 g daily Subsequently has required lower doses with the last change made in 5/17 on her last visit  Recent history: She has not followed up since her last visit change as directed Although she has had a gastric sleeve procedure in January she has lost only an additional 16 pounds but overall has lost over 40 pounds since her initial visit with the bariatric clinic  She does feel fairly good overall no unusual fatigue, sluggishness or dry skin Since her last visit she has been taking 175 g levothyroxine, 7-1/2 tablets per week TSH level is now normal at 0.57         Patient's weight history is as follows:  Wt Readings from Last 3 Encounters:  04/11/17 260 lb 9.6 oz (118.2 kg)  02/22/17 260 lb 14.4 oz (118.3 kg)  02/03/17 261 lb (118.4 kg)    Thyroid function results have been as follows:  Lab Results  Component Value Date   FREET4 1.05 03/23/2017   FREET4 1.40 04/18/2016   FREET4 1.63 (H) 01/15/2016   TSH 0.57 03/23/2017   TSH 0.13 (L) 04/18/2016   TSH 0.24 (L) 01/15/2016    THYROID nodule: She was sent for her thyroid  ultrasound in 2015 and she was not told why. This showed a mostly solid 1.5 cm nodule on the right side.    Needle aspiration in 3/17 showed benign follicular nodule    Past Medical History:  Diagnosis Date  . Acid reflux   . Back pain   . Fluid retention   . Gallstones   . Graves disease    1999 had iodine radiation  . Graves disease    RAI ablation   . Hypertension   . Hypothyroidism   . Morbid obesity ([place])   . Osteoarthritis     Past Surgical History:  Procedure Laterality Date  . CHOLECYSTECTOMY    . TUBAL LIGATION    . Uterine Ablation      Family History  Problem Relation Age of Onset  . Sudden Cardiac Death Mother     Saw her sister get killed, which led to heart attack  . Hypertension Maternal Grandmother   . Alzheimer's disease Maternal Grandfather   . Thyroid disease Paternal Grandmother   . Colon cancer Neg Hx     Social History:  reports that she has never smoked. She has never used smokeless tobacco. She reports that she drinks alcohol. She reports that she does not use drugs.  Allergies:  Allergies  Allergen Reactions  . Lobster [Shellfish Allergy] Rash    Lobster  .  Tylenol [Acetaminophen] Hives and Swelling    Allergies as of 04/11/2017      Reactions   Lobster [shellfish Allergy] Rash   Lobster   Tylenol [acetaminophen] Hives, Swelling      Medication List       Accurate as of 04/11/17  3:03 PM. Always use your most recent med list.          Cholecalciferol 5000 units Tabs Commonly known as:  D-5000 Take 1 tablet (5,000 Units total) by mouth daily. Reported on 05/31/2016   ferrous fumarate 325 (106 Fe) MG Tabs tablet Commonly known as:  HEMOCYTE - 106 mg FE Take 1 tablet (106 mg of iron total) by mouth daily as needed (iron deficiency).   hydrochlorothiazide 25 MG tablet Commonly known as:  HYDRODIURIL TAKE 1 TABLET (25 MG TOTAL) BY MOUTH DAILY.   ibuprofen 600 MG tablet Commonly known as:  ADVIL,MOTRIN Take 1 tablet (600 mg  total) by mouth every 8 (eight) hours as needed.   levothyroxine 175 MCG tablet Commonly known as:  SYNTHROID 1 qd plus extra half on Sundays   methylPREDNISolone 4 MG Tbpk tablet Commonly known as:  MEDROL DOSEPAK Take as directed   tiZANidine 4 MG tablet Commonly known as:  ZANAFLEX Take 1 tablet (4 mg total) by mouth every 6 (six) hours as needed for muscle spasms.   traMADol 50 MG tablet Commonly known as:  ULTRAM Take 1 tablet (50 mg total) by mouth every 8 (eight) hours as needed for severe pain.         Review of Systems              Examination:    BP 130/86   Pulse 63   Ht 5' 5.5" (1.664 m)   Wt 260 lb 9.6 oz (118.2 kg)   SpO2 98%   BMI 42.71 kg/m   Biceps reflexes normal No pedal edema  Assessment:  HYPOTHYROIDISM, post radioactive iodine treatment, reportedly for Graves' disease  She has required lower doses of supplementation over the last year and a half Now taking 175 g, 7-1/2 tablets a week With her bariatric surgery she may be continuing to lose weight and may require somewhat less overdose Is on generic medications Fairly compliant with her dosage in the mornings  PLAN:   Reduce levothyroxine to 175 g, take 1 tablet daily only  Follow-up in 6 months   Denetra Formoso 04/11/2017, 3:03 PM

## 2017-04-11 NOTE — Patient Instructions (Signed)
Take 1 pill daily 7/7 days

## 2017-04-12 ENCOUNTER — Other Ambulatory Visit: Payer: Self-pay | Admitting: Endocrinology

## 2017-05-19 ENCOUNTER — Encounter: Payer: Self-pay | Admitting: Adult Health

## 2017-05-19 ENCOUNTER — Ambulatory Visit (INDEPENDENT_AMBULATORY_CARE_PROVIDER_SITE_OTHER): Payer: BLUE CROSS/BLUE SHIELD | Admitting: Adult Health

## 2017-05-19 VITALS — BP 124/68 | Temp 98.1°F | Ht 65.5 in | Wt 261.0 lb

## 2017-05-19 DIAGNOSIS — M25561 Pain in right knee: Secondary | ICD-10-CM

## 2017-05-19 DIAGNOSIS — G8929 Other chronic pain: Secondary | ICD-10-CM | POA: Diagnosis not present

## 2017-05-19 MED ORDER — METHYLPREDNISOLONE ACETATE 80 MG/ML IJ SUSP
80.0000 mg | Freq: Once | INTRAMUSCULAR | Status: AC
  Administered 2017-05-19: 80 mg via INTRAMUSCULAR

## 2017-05-19 NOTE — Progress Notes (Signed)
Subjective:    Patient ID: Cheryl Stone, female    DOB: 1966/06/29, 51 y.o.   MRN: [id number]  HPI  51 year old female who  has a past medical history of Acid reflux; Back pain; Fluid retention; Gallstones; Graves disease; Graves disease; Hypertension; Hypothyroidism; Morbid obesity ([place]); and Osteoarthritis.    She presents to the office today with the complaint of right knee pain that has become worse over the last month. She has had issues in the past with chronic left knee pain. She has had a steroid injection into the left knee in the past and reported good results. She has been working out more often as of lately due to increasing weight gain after gastric sleeve surgery. Currently she is walking about 2 miles per day and has started doing a " 28 day challenge" which includes light jogging, lunges, and squats.   She finds that her knee is is especially painful at night when she is trying to sleep.   She denies any trauma and is able to continue to exercise without much difficulty.    Review of Systems See HPI  Past Medical History:  Diagnosis Date  . Acid reflux   . Back pain   . Fluid retention   . Gallstones   . Graves disease    1999 had iodine radiation  . Graves disease    RAI ablation   . Hypertension   . Hypothyroidism   . Morbid obesity ([place])   . Osteoarthritis     Social History   Social History  . Marital status: Single    Spouse name: N/A  . Number of children: N/A  . Years of education: N/A   Occupational History  . Not on file.   Social History Main Topics  . Smoking status: Never Smoker  . Smokeless tobacco: Never Used  . Alcohol use 0.0 oz/week     Comment: once every 2 months  . Drug use: No  . Sexual activity: Not on file   Other Topics Concern  . 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        Past Surgical History:  Procedure  Laterality Date  . CHOLECYSTECTOMY    . TUBAL LIGATION    . Uterine Ablation      Family History  Problem Relation Age of Onset  . Sudden Cardiac Death Mother        Saw her sister get killed, which led to heart attack  . Hypertension Maternal Grandmother   . Alzheimer's disease Maternal Grandfather   . Thyroid disease Paternal Grandmother   . Colon cancer Neg Hx     Allergies  Allergen Reactions  . Lobster [Shellfish Allergy] Rash    Lobster  . Tylenol [Acetaminophen] Hives and Swelling    Current Outpatient Prescriptions on File Prior to Visit  Medication Sig Dispense Refill  . Cholecalciferol (D-5000) 5000 units TABS Take 1 tablet (5,000 Units total) by mouth daily. Reported on 05/31/2016 90 tablet 2  . ferrous fumarate (HEMOCYTE - 106 MG FE) 325 (106 Fe) MG TABS tablet Take 1 tablet (106 mg of iron total) by mouth daily as needed (iron deficiency). 30 each 3  . hydrochlorothiazide (HYDRODIURIL) 25 MG tablet TAKE 1 TABLET (25 MG TOTAL) BY MOUTH DAILY. (Patient taking differently: TAKE 1 TABLET (25 MG TOTAL) AS NEEDED) 90 tablet  0  . ibuprofen (ADVIL,MOTRIN) 600 MG tablet Take 1 tablet (600 mg total) by mouth every 8 (eight) hours as needed. 30 tablet 0  . levothyroxine (SYNTHROID, LEVOTHROID) 175 MCG tablet TAKE 1 TABLET EVERY DAY AND TAKE AND EXTRA 1/2 TABLET ON SUNDAYS 102 tablet 3  . tiZANidine (ZANAFLEX) 4 MG tablet Take 1 tablet (4 mg total) by mouth every 6 (six) hours as needed for muscle spasms. 30 tablet 0  . traMADol (ULTRAM) 50 MG tablet Take 1 tablet (50 mg total) by mouth every 8 (eight) hours as needed for severe pain. 90 tablet 0   No current facility-administered medications on file prior to visit.     BP 124/68 (BP Location: Left Arm, Patient Position: Sitting, Cuff Size: Normal)   Temp 98.1 F (36.7 C) (Oral)   Ht 5' 5.5" (1.664 m)   Wt 261 lb (118.4 kg)   BMI 42.77 kg/m       Objective:   Physical Exam  Constitutional: She is oriented to person,  place, and time. She appears well-developed and well-nourished. No distress.  Musculoskeletal:       Right knee: She exhibits decreased range of motion and bony tenderness. She exhibits no swelling, no effusion, no deformity, normal alignment and no LCL laxity. Tenderness found. No medial joint line, no lateral joint line, no MCL, no LCL and no patellar tendon tenderness noted.  Neurological: She is alert and oriented to person, place, and time.  Skin: Skin is warm and dry. No rash noted. She is not diaphoretic. No erythema. No pallor.  Psychiatric: She has a normal mood and affect. Her behavior is normal. Judgment and thought content normal.  Nursing note and vitals reviewed.     Assessment & Plan:  Right knee injection Verbal consent obtained and verified. Sterile betadine prep. Furthur cleansed with alcohol. Topical analgesic spray: Ethyl chloride. Joint: Right knee Approached in typical fashion with: anterolateral approach  Completed without difficulty Meds: 3 cc lidocaine 2% no epi, 1 cc depomedrol 80mg /cc Needle:1.5 inch 25 gauge Aftercare instructions and Red flags advised. - follow up if no improvement  - Consider referral to orthopedics   Dorothyann Peng, NP

## 2017-05-24 ENCOUNTER — Encounter: Payer: Self-pay | Admitting: Adult Health

## 2017-05-24 ENCOUNTER — Other Ambulatory Visit: Payer: Self-pay | Admitting: Adult Health

## 2017-05-24 DIAGNOSIS — M25562 Pain in left knee: Principal | ICD-10-CM

## 2017-05-24 DIAGNOSIS — G8929 Other chronic pain: Secondary | ICD-10-CM

## 2017-05-24 DIAGNOSIS — M25561 Pain in right knee: Principal | ICD-10-CM

## 2017-05-24 DIAGNOSIS — Z760 Encounter for issue of repeat prescription: Secondary | ICD-10-CM

## 2017-05-24 MED ORDER — TRAMADOL HCL 50 MG PO TABS: 50.0000 mg | ORAL_TABLET | Freq: Three times a day (TID) | ORAL | 0 refills | Status: DC | PRN

## 2017-06-06 ENCOUNTER — Ambulatory Visit (INDEPENDENT_AMBULATORY_CARE_PROVIDER_SITE_OTHER): Payer: Self-pay

## 2017-06-06 ENCOUNTER — Ambulatory Visit (INDEPENDENT_AMBULATORY_CARE_PROVIDER_SITE_OTHER): Payer: BLUE CROSS/BLUE SHIELD | Admitting: Orthopaedic Surgery

## 2017-06-06 DIAGNOSIS — M25562 Pain in left knee: Secondary | ICD-10-CM | POA: Diagnosis not present

## 2017-06-06 DIAGNOSIS — M25561 Pain in right knee: Secondary | ICD-10-CM | POA: Diagnosis not present

## 2017-06-06 MED ORDER — DICLOFENAC SODIUM 75 MG PO TBEC: 75.0000 mg | DELAYED_RELEASE_TABLET | Freq: Two times a day (BID) | ORAL | 2 refills | Status: DC

## 2017-06-06 NOTE — Progress Notes (Signed)
Office Visit Note   Patient: Cheryl Stone           Date of Birth: 03/14/1966           MRN: [id number] Visit Date: 06/06/2017              Requested by: Dorothyann Peng, NP [address], [place] [address] PCP: Dorothyann Peng, NP   Assessment & Plan: Visit Diagnoses:  1. Left knee pain, unspecified chronicity   2. Right knee pain, unspecified chronicity     Plan: Patient has advanced degenerative joint disease of bilateral knees slightly worse than left. Diclofenac was prescribed. Patient does not want an injection. She is not ready for knee replacement. She has my card if she needs me follow-up with me as needed.  Follow-Up Instructions: Return if symptoms worsen or fail to improve.   Orders:  Orders Placed This Encounter  Procedures  . XR Knee 1-2 Views Right  . XR Knee 1-2 Views Left   Meds ordered this encounter  Medications  . diclofenac (VOLTAREN) 75 MG EC tablet    Sig: Take 1 tablet (75 mg total) by mouth 2 (two) times daily.    Dispense:  30 tablet    Refill:  2      Procedures: No procedures performed   Clinical Data: No additional findings.   Subjective: No chief complaint on file.   Cheryl Stone is a very pleasant 51 year old female comes in with bilateral knee pain for many years is worse with activity. The pain is worse in her left knee. She has had cortisone injections in the past with temporary relief. She is had a gastric bypass surgery with some weight loss but she is still at a BMI of 42. She is exercising as much as she can as much as her knees will allow her to. Her pain does not radiate. She's had knee effusions in the past. She denies any swelling today.    Review of Systems  Constitutional: Negative.   HENT: Negative.   Eyes: Negative.   Respiratory: Negative.   Cardiovascular: Negative.   Endocrine: Negative.   Musculoskeletal: Negative.   Neurological: Negative.   Hematological: Negative.   Psychiatric/Behavioral:  Negative.   All other systems reviewed and are negative.    Objective: Vital Signs: There were no vitals taken for this visit.  Physical Exam  Constitutional: She is oriented to person, place, and time. She appears well-developed and well-nourished.  HENT:  Head: Normocephalic and atraumatic.  Eyes: EOM are normal.  Neck: Neck supple.  Pulmonary/Chest: Effort normal.  Abdominal: Soft.  Neurological: She is alert and oriented to person, place, and time.  Skin: Skin is warm. Capillary refill takes less than 2 seconds.  Psychiatric: She has a normal mood and affect. Her behavior is normal. Judgment and thought content normal.  Nursing note and vitals reviewed.   Ortho Exam Bilateral knee exam shows no joint effusion. Collaterals and cruciates are stable. Specialty Comments:  No specialty comments available.  Imaging: Xr Knee 1-2 Views Left  Result Date: 06/06/2017 [facility]  Xr Knee 1-2 Views Right  Result Date: 06/06/2017 [facility]    PMFS History: Patient Active Problem List   Diagnosis Date Noted  . Left knee pain 06/06/2017  . Right knee pain 06/06/2017  . Hypothyroidism 06/23/2015  . Essential hypertension 06/23/2015   Past Medical History:  Diagnosis Date  . Acid reflux   . Back pain   . Fluid retention   .  Gallstones   . Graves disease    1999 had iodine radiation  . Graves disease    RAI ablation   . Hypertension   . Hypothyroidism   . Morbid obesity ([place])   . Osteoarthritis     Family History  Problem Relation Age of Onset  . Sudden Cardiac Death Mother        Saw her sister get killed, which led to heart attack  . Hypertension Maternal Grandmother   . Alzheimer's disease Maternal Grandfather   . Thyroid disease Paternal Grandmother   . Colon cancer Neg Hx     Past Surgical History:  Procedure Laterality Date  . CHOLECYSTECTOMY    . TUBAL LIGATION    . Uterine Ablation     Social History   Occupational History  . Not on file.    Social History Main Topics  . Smoking status: Never Smoker  . Smokeless tobacco: Never Used  . Alcohol use 0.0 oz/week     Comment: once every 2 months  . Drug use: No  . Sexual activity: Not on file

## 2017-06-06 NOTE — Addendum Note (Signed)
Addended by: Minda Ditto, Alexa Golebiewski N on: 06/06/2017 05:00 PM   Modules accepted: Orders

## 2017-07-17 ENCOUNTER — Other Ambulatory Visit (INDEPENDENT_AMBULATORY_CARE_PROVIDER_SITE_OTHER): Payer: Self-pay | Admitting: Orthopaedic Surgery

## 2017-08-02 ENCOUNTER — Telehealth: Payer: Self-pay | Admitting: Adult Health

## 2017-08-02 DIAGNOSIS — Z0289 Encounter for other administrative examinations: Secondary | ICD-10-CM

## 2017-08-02 NOTE — Telephone Encounter (Signed)
Patient dropped off Handicapped placard form to be filled out on 08/02/2017.  Patient would like to be called when form is ready to be picked up at [phone].  Placed forms in providers box.

## 2017-08-03 NOTE — Telephone Encounter (Signed)
Left a message for the pt to pick up at the front desk. 

## 2017-08-22 ENCOUNTER — Other Ambulatory Visit: Payer: Self-pay | Admitting: Adult Health

## 2017-08-22 ENCOUNTER — Encounter: Payer: Self-pay | Admitting: Adult Health

## 2017-08-22 MED ORDER — LORCASERIN HCL 10 MG PO TABS
10.0000 mg | ORAL_TABLET | Freq: Two times a day (BID) | ORAL | 0 refills | Status: DC
Start: 2017-08-22 — End: 2017-10-03

## 2017-08-24 ENCOUNTER — Encounter: Payer: Self-pay | Admitting: Adult Health

## 2017-08-24 ENCOUNTER — Ambulatory Visit (INDEPENDENT_AMBULATORY_CARE_PROVIDER_SITE_OTHER): Payer: BLUE CROSS/BLUE SHIELD | Admitting: Adult Health

## 2017-08-24 VITALS — BP 110/72 | Temp 98.1°F | Wt 266.0 lb

## 2017-08-24 DIAGNOSIS — J301 Allergic rhinitis due to pollen: Secondary | ICD-10-CM | POA: Diagnosis not present

## 2017-08-24 DIAGNOSIS — Z23 Encounter for immunization: Secondary | ICD-10-CM | POA: Diagnosis not present

## 2017-08-24 MED ORDER — FLUTICASONE PROPIONATE 50 MCG/ACT NA SUSP: 2.0000 | Freq: Every day | NASAL | 6 refills | Status: DC

## 2017-08-24 NOTE — Progress Notes (Addendum)
Subjective:    Patient ID: Cheryl Stone, female    DOB: May 31, 1966, 51 y.o.   MRN: [id number]  Sinusitis  This is a new problem. The current episode started in the past 7 days (3 days ago ). There has been no fever. Associated symptoms include congestion and sinus pressure. Pertinent negatives include no ear pain, headaches or sore throat. Past treatments include oral decongestants. The treatment provided mild relief.      Review of Systems  Constitutional: Negative.   HENT: Positive for congestion, rhinorrhea, sinus pain and sinus pressure. Negative for ear pain, postnasal drip and sore throat.   Eyes: Positive for discharge (clear ). Negative for photophobia, pain, redness, itching and visual disturbance.  Respiratory: Negative.   Cardiovascular: Negative.   Neurological: Negative.  Negative for headaches.  All other systems reviewed and are negative.  Past Medical History:  Diagnosis Date  . Acid reflux   . Back pain   . Fluid retention   . Gallstones   . Graves disease    1999 had iodine radiation  . Graves disease    RAI ablation   . Hypertension   . Hypothyroidism   . Morbid obesity ([place])   . Osteoarthritis     Social History   Social History  . Marital status: Single    Spouse name: N/A  . Number of children: N/A  . Years of education: N/A   Occupational History  . Not on file.   Social History Main Topics  . Smoking status: Never Smoker  . Smokeless tobacco: Never Used  . Alcohol use 0.0 oz/week     Comment: once every 2 months  . Drug use: No  . Sexual activity: Not on file   Other Topics Concern  . 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        Past Surgical History:  Procedure Laterality Date  . CHOLECYSTECTOMY    . TUBAL LIGATION    . Uterine Ablation      Family History  Problem Relation Age of Onset  . Sudden Cardiac Death Mother         Saw her sister get killed, which led to heart attack  . Hypertension Maternal Grandmother   . Alzheimer's disease Maternal Grandfather   . Thyroid disease Paternal Grandmother   . Colon cancer Neg Hx     Allergies  Allergen Reactions  . Lobster [Shellfish Allergy] Rash    Lobster  . Tylenol [Acetaminophen] Hives and Swelling    Current Outpatient Prescriptions on File Prior to Visit  Medication Sig Dispense Refill  . diclofenac (VOLTAREN) 75 MG EC tablet Take 1 tablet (75 mg total) by mouth 2 (two) times daily. 30 tablet 2  . diclofenac (VOLTAREN) 75 MG EC tablet TAKE 1 TABLET BY MOUTH TWICE A DAY 30 tablet 2  . levothyroxine (SYNTHROID, LEVOTHROID) 175 MCG tablet TAKE 1 TABLET EVERY DAY AND TAKE AND EXTRA 1/2 TABLET ON SUNDAYS 102 tablet 3  . Lorcaserin HCl (BELVIQ) 10 MG TABS Take 10 mg by mouth 2 (two) times daily. 60 tablet 0  . traMADol (ULTRAM) 50 MG tablet   0   No current facility-administered medications on file prior to visit.     BP 110/72 (BP Location: Left Arm)   Temp 98.1 F (36.7 C) (Oral)   Wt 266 lb (120.7 kg)  BMI 43.59 kg/m       Objective:   Physical Exam  Constitutional: She is oriented to person, place, and time. She appears well-developed and well-nourished. No distress.  HENT:  Head: Normocephalic and atraumatic.  Right Ear: Hearing, tympanic membrane, external ear and ear canal normal.  Left Ear: Hearing, tympanic membrane, external ear and ear canal normal.  Nose: Rhinorrhea (clear) present. No mucosal edema. Right sinus exhibits no maxillary sinus tenderness and no frontal sinus tenderness. Left sinus exhibits no maxillary sinus tenderness and no frontal sinus tenderness.  Mouth/Throat: Uvula is midline, oropharynx is clear and moist and mucous membranes are normal. No oropharyngeal exudate.  Eyes: Pupils are equal, round, and reactive to light. Conjunctivae and EOM are normal. Right eye exhibits no discharge. No scleral icterus.  Neck:  Normal range of motion. Neck supple. No thyromegaly present.  Cardiovascular: Normal rate, regular rhythm, normal heart sounds and intact distal pulses.  Exam reveals no gallop and no friction rub.   No murmur heard. Pulmonary/Chest: Effort normal and breath sounds normal. No respiratory distress. She has no wheezes. She has no rales.  Lymphadenopathy:    She has no cervical adenopathy.  Neurological: She is alert and oriented to person, place, and time.  Skin: Skin is warm and dry. No rash noted. No erythema. No pallor.  Psychiatric: She has a normal mood and affect. Her behavior is normal. Judgment and thought content normal.  Nursing note and vitals reviewed.     Assessment & Plan:  1. Seasonal allergic rhinitis due to pollen - No concern for bacterial infection. Will have her try supportitive measures with Flonase and netty pot  - fluticasone (FLONASE) 50 MCG/ACT nasal spray; Place 2 sprays into both nostrils daily.  Dispense: 16 g; Refill: 6  2. Need for immunization against influenza - Flu Vaccine QUAD 6+ mos PF IM (Fluarix Quad PF)    Dorothyann Peng, NP

## 2017-08-24 NOTE — Addendum Note (Signed)
Addended by: Apolinar Junes on: 08/24/2017 01:57 PM   Modules accepted: Orders

## 2017-08-25 ENCOUNTER — Encounter: Payer: Self-pay | Admitting: Adult Health

## 2017-08-29 NOTE — Telephone Encounter (Signed)
Vitamin D 5000IU last filled 01/26/17, #90 x 2 refills.  Would you like to continue this?

## 2017-09-01 ENCOUNTER — Other Ambulatory Visit: Payer: Self-pay | Admitting: Emergency Medicine

## 2017-09-01 MED ORDER — CHOLECALCIFEROL 125 MCG (5000 UT) PO CAPS: 5000.0000 [IU] | ORAL_CAPSULE | Freq: Every day | ORAL | 2 refills | Status: AC

## 2017-09-22 ENCOUNTER — Other Ambulatory Visit: Payer: Self-pay | Admitting: Adult Health

## 2017-09-24 ENCOUNTER — Other Ambulatory Visit (INDEPENDENT_AMBULATORY_CARE_PROVIDER_SITE_OTHER): Payer: Self-pay | Admitting: Orthopaedic Surgery

## 2017-09-26 NOTE — Telephone Encounter (Signed)
Denied.  Pt needs an appointment for refills of this medication.

## 2017-10-03 ENCOUNTER — Ambulatory Visit (INDEPENDENT_AMBULATORY_CARE_PROVIDER_SITE_OTHER): Payer: BLUE CROSS/BLUE SHIELD | Admitting: Adult Health

## 2017-10-03 ENCOUNTER — Encounter: Payer: Self-pay | Admitting: Adult Health

## 2017-10-03 DIAGNOSIS — M25562 Pain in left knee: Secondary | ICD-10-CM

## 2017-10-03 DIAGNOSIS — G8929 Other chronic pain: Secondary | ICD-10-CM

## 2017-10-03 DIAGNOSIS — M25561 Pain in right knee: Secondary | ICD-10-CM | POA: Diagnosis not present

## 2017-10-03 MED ORDER — TRAMADOL HCL 50 MG PO TABS: 50.0000 mg | ORAL_TABLET | Freq: Every evening | ORAL | 0 refills | Status: DC | PRN

## 2017-10-03 MED ORDER — LORCASERIN HCL 10 MG PO TABS: 10.0000 mg | ORAL_TABLET | Freq: Two times a day (BID) | ORAL | 0 refills | Status: DC

## 2017-10-03 NOTE — Progress Notes (Signed)
Subjective:    Patient ID: Cheryl Stone, female    DOB: 1966-03-08, 51 y.o.   MRN: [id number]  HPI  51 year old female who  has a past medical history of Acid reflux; Back pain; Fluid retention; Gallstones; Graves disease; Graves disease; Hypertension; Hypothyroidism; Morbid obesity ([place]); and Osteoarthritis.  She presents to the office today for follow up regarding weight loss. She was restarted on Belviq in September 2018. Today in the office she reports that she has been eating healthy. She is  unable to exercise due to chronic knee pain, she is debating having knee surgery but is now leaning towards it. She is going to make an appointment to see Dr. Erlinda Hong. She needs a refill of Tramadol. Her last refill was in July for 30 pills.   She has been able to lose 9 pounds this month. She denies any side effects of the medication   Wt Readings from Last 3 Encounters:  10/03/17 257 lb (116.6 kg)  08/24/17 266 lb (120.7 kg)  05/19/17 261 lb (118.4 kg)    Review of Systems See HPI   Past Medical History:  Diagnosis Date  . Acid reflux   . Back pain   . Fluid retention   . Gallstones   . Graves disease    1999 had iodine radiation  . Graves disease    RAI ablation   . Hypertension   . Hypothyroidism   . Morbid obesity ([place])   . Osteoarthritis     Social History   Social History  . Marital status: Single    Spouse name: N/A  . Number of children: N/A  . Years of education: N/A   Occupational History  . Not on file.   Social History Main Topics  . Smoking status: Never Smoker  . Smokeless tobacco: Never Used  . Alcohol use 0.0 oz/week     Comment: once every 2 months  . Drug use: No  . Sexual activity: Not on file   Other Topics Concern  . 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        Past Surgical History:  Procedure Laterality Date  . CHOLECYSTECTOMY    .  TUBAL LIGATION    . Uterine Ablation      Family History  Problem Relation Age of Onset  . Sudden Cardiac Death Mother        Saw her sister get killed, which led to heart attack  . Hypertension Maternal Grandmother   . Alzheimer's disease Maternal Grandfather   . Thyroid disease Paternal Grandmother   . Colon cancer Neg Hx     Allergies  Allergen Reactions  . Lobster [Shellfish Allergy] Rash    Lobster  . Tylenol [Acetaminophen] Hives and Swelling    Current Outpatient Prescriptions on File Prior to Visit  Medication Sig Dispense Refill  . Cholecalciferol 5000 units capsule Take 1 capsule (5,000 Units total) by mouth daily. 90 capsule 2  . diclofenac (VOLTAREN) 75 MG EC tablet Take 1 tablet (75 mg total) by mouth 2 (two) times daily. 30 tablet 2  . fluticasone (FLONASE) 50 MCG/ACT nasal spray Place 2 sprays into both nostrils daily. 16 g 6  . levothyroxine (SYNTHROID, LEVOTHROID) 175 MCG tablet TAKE 1 TABLET EVERY DAY AND TAKE AND EXTRA 1/2 TABLET ON SUNDAYS 102 tablet 3   No current  facility-administered medications on file prior to visit.     BP 124/82 (BP Location: Left Arm)   Temp 98.4 F (36.9 C) (Oral)   Wt 257 lb (116.6 kg)   BMI 42.12 kg/m       Objective:   Physical Exam  Constitutional: She is oriented to person, place, and time. She appears well-developed and well-nourished. No distress.  Obese   Cardiovascular: Normal rate, regular rhythm, normal heart sounds and intact distal pulses.  Exam reveals no gallop.   No murmur heard. Pulmonary/Chest: Effort normal and breath sounds normal. No respiratory distress. She has no wheezes. She has no rales. She exhibits no tenderness.  Neurological: She is alert and oriented to person, place, and time.  Skin: Skin is warm and dry. No rash noted. She is not diaphoretic. No erythema. No pallor.  Psychiatric: She has a normal mood and affect. Her behavior is normal. Judgment and thought content normal.  Nursing note  and vitals reviewed.     Assessment & Plan:  1. Morbidly obese ([facility])  - Lorcaserin HCl (BELVIQ) 10 MG TABS; Take 10 mg by mouth 2 (two) times daily.  Dispense: 60 tablet; Refill: 0 - Follow up in one month  2. Chronic pain of both knees  - traMADol (ULTRAM) 50 MG tablet; Take 1 tablet (50 mg total) by mouth at bedtime as needed.  Dispense: 15 tablet; Refill: 0   Dorothyann Peng, NP

## 2017-10-09 ENCOUNTER — Other Ambulatory Visit (INDEPENDENT_AMBULATORY_CARE_PROVIDER_SITE_OTHER): Payer: BLUE CROSS/BLUE SHIELD

## 2017-10-09 DIAGNOSIS — E890 Postprocedural hypothyroidism: Secondary | ICD-10-CM | POA: Diagnosis not present

## 2017-10-09 LAB — TSH: TSH: 1.73 u[IU]/mL (ref 0.35–4.50)

## 2017-10-09 LAB — T4, FREE: FREE T4: 1.03 ng/dL (ref 0.60–1.60)

## 2017-10-10 ENCOUNTER — Other Ambulatory Visit: Payer: BLUE CROSS/BLUE SHIELD

## 2017-10-16 ENCOUNTER — Encounter: Payer: Self-pay | Admitting: Endocrinology

## 2017-10-16 ENCOUNTER — Ambulatory Visit (INDEPENDENT_AMBULATORY_CARE_PROVIDER_SITE_OTHER): Payer: BLUE CROSS/BLUE SHIELD | Admitting: Endocrinology

## 2017-10-16 VITALS — BP 126/80 | HR 61 | Ht 65.5 in | Wt 265.0 lb

## 2017-10-16 DIAGNOSIS — E890 Postprocedural hypothyroidism: Secondary | ICD-10-CM

## 2017-10-16 NOTE — Progress Notes (Signed)
Patient ID: Cheryl Stone, female   DOB: 1966-08-03, 51 y.o.   MRN: [id number]            Reason for Appointment:  Hypothyroidism, follow-up visit    History of Present Illness:    Hypothyroidism was first diagnosed in 03/1998  At that time patient was having symptoms of  fatigue, cold sensitivity and weight loss. She was found to be hyperthyroid and she was also having significant difficulty swallowing. She was told that her thyroid was the size of a golf ball and she was having Graves' disease She was given radioactive iodine  Subsequently her labs showed that she was hypothyroid and she was started on thyroid supplements.  At that time she was feeling fairly well. She was followed regularly before she moved here and apparently her levothyroxine dose has been increased stepwise About 2 years ago she was taking 200 g and subsequently was on 224 g daily Subsequently has required lower doses with the last change made in 5/17 on her last visit  Recent history:   She was feeling fairly good with her follow-up visit in 5/18 but because of her low normal TSH her dosage was reduced by half tablet weekly She does feel fairly good overall no unusual fatigue, sluggishness or dry skin Since her last visit she has been taking 175 g levothyroxine, 7 tablets per week TSH level is now normal at 1.7   Again she feels fairly good with no unusual fatigue Her weight has gone up slightly         Patient's weight history is as follows:  Wt Readings from Last 3 Encounters:  10/16/17 265 lb (120.2 kg)  10/03/17 257 lb (116.6 kg)  08/24/17 266 lb (120.7 kg)    Thyroid function results have been as follows:  Lab Results  Component Value Date   TSH 1.73 10/09/2017   TSH 0.57 03/23/2017   TSH 0.13 (L) 04/18/2016   FREET4 1.03 10/09/2017   FREET4 1.05 03/23/2017   FREET4 1.40 04/18/2016     THYROID nodule: She was sent for her thyroid ultrasound in 2015 and she was not told why. This  showed a mostly solid 1.5 cm nodule on the right side.    Needle aspiration in 3/17 showed benign follicular nodule    Past Medical History:  Diagnosis Date  . Acid reflux   . Back pain   . Fluid retention   . Gallstones   . Graves disease    1999 had iodine radiation  . Graves disease    RAI ablation   . Hypertension   . Hypothyroidism   . Morbid obesity ([place])   . Osteoarthritis     Past Surgical History:  Procedure Laterality Date  . CHOLECYSTECTOMY    . TUBAL LIGATION    . Uterine Ablation      Family History  Problem Relation Age of Onset  . Sudden Cardiac Death Mother        Saw her sister get killed, which led to heart attack  . Hypertension Maternal Grandmother   . Alzheimer's disease Maternal Grandfather   . Thyroid disease Paternal Grandmother   . Colon cancer Neg Hx     Social History:  reports that  has never smoked. she has never used smokeless tobacco. She reports that she drinks alcohol. She reports that she does not use drugs.  Allergies:  Allergies  Allergen Reactions  . Lobster [Shellfish Allergy] Rash    Lobster  . Tylenol [Acetaminophen]  Hives and Swelling    Allergies as of 10/16/2017      Reactions   Lobster [shellfish Allergy] Rash   Lobster   Tylenol [acetaminophen] Hives, Swelling      Medication List        Accurate as of 10/16/17  1:13 PM. Always use your most recent med list.          Cholecalciferol 5000 units capsule Take 1 capsule (5,000 Units total) by mouth daily.   diclofenac 75 MG EC tablet Commonly known as:  VOLTAREN Take 1 tablet (75 mg total) by mouth 2 (two) times daily.   fluticasone 50 MCG/ACT nasal spray Commonly known as:  FLONASE Place 2 sprays into both nostrils daily.   levothyroxine 175 MCG tablet Commonly known as:  SYNTHROID, LEVOTHROID TAKE 1 TABLET EVERY DAY AND TAKE AND EXTRA 1/2 TABLET ON SUNDAYS   Lorcaserin HCl 10 MG Tabs Commonly known as:  BELVIQ Take 10 mg by mouth 2 (two) times  daily.   traMADol 50 MG tablet Commonly known as:  ULTRAM Take 1 tablet (50 mg total) by mouth at bedtime as needed.         Review of Systems  She has been prescribed weight loss medication but does not take this regularly             Examination:    BP 126/80   Pulse 61   Ht 5' 5.5" (1.664 m)   Wt 265 lb (120.2 kg)   SpO2 98%   BMI 43.43 kg/m   No nodules felt on either side of the thyroid Biceps reflexes normal No pedal edema  Assessment:  HYPOTHYROIDISM, post radioactive iodine treatment, reportedly for Graves' disease  She has required lower doses of supplementation since 2017 Now taking 175 g  She has not felt any different with her dosage changes but is quite compliant with her regimen  TSH back to normal  Thyroid nodules: These were benign previously and are not clinically palpable, will continue to follow with physical exams only   PLAN:   She will continue 175 g, take 1 tablet daily only  Follow-up in 6 months   Cari Burgo 10/16/2017, 1:13 PM

## 2017-10-24 ENCOUNTER — Encounter (INDEPENDENT_AMBULATORY_CARE_PROVIDER_SITE_OTHER): Payer: Self-pay | Admitting: Orthopaedic Surgery

## 2017-10-24 ENCOUNTER — Other Ambulatory Visit (INDEPENDENT_AMBULATORY_CARE_PROVIDER_SITE_OTHER): Payer: Self-pay

## 2017-10-24 MED ORDER — TRAMADOL HCL 50 MG PO TABS: ORAL_TABLET | ORAL | 0 refills | Status: DC

## 2017-10-24 NOTE — Telephone Encounter (Signed)
Tramadol 50 mg. 1-2 tabs po tid prn pain #30

## 2017-11-14 ENCOUNTER — Other Ambulatory Visit (INDEPENDENT_AMBULATORY_CARE_PROVIDER_SITE_OTHER): Payer: Self-pay | Admitting: Orthopaedic Surgery

## 2017-11-14 MED ORDER — TRAMADOL HCL 50 MG PO TABS: ORAL_TABLET | ORAL | 0 refills | Status: DC

## 2017-11-14 NOTE — Telephone Encounter (Signed)
Rx request 

## 2017-11-14 NOTE — Telephone Encounter (Signed)
yes

## 2017-12-07 ENCOUNTER — Other Ambulatory Visit (INDEPENDENT_AMBULATORY_CARE_PROVIDER_SITE_OTHER): Payer: Self-pay | Admitting: Orthopaedic Surgery

## 2017-12-07 NOTE — Telephone Encounter (Signed)
Xu patient

## 2017-12-07 NOTE — Telephone Encounter (Signed)
yes

## 2017-12-07 NOTE — Telephone Encounter (Signed)
Is this okay to fill? 

## 2017-12-12 ENCOUNTER — Other Ambulatory Visit (INDEPENDENT_AMBULATORY_CARE_PROVIDER_SITE_OTHER): Payer: Self-pay

## 2017-12-12 MED ORDER — TRAMADOL HCL 50 MG PO TABS: ORAL_TABLET | ORAL | 0 refills | Status: DC

## 2017-12-12 NOTE — Telephone Encounter (Signed)
Called into pharm  

## 2018-01-03 IMAGING — MR MR LUMBAR SPINE W/O CM
4 of 5 series · 26 of 48 positions shown · non-contrast
Comparison: None.

CLINICAL DATA: Low back pain and left leg pain for several months.
Pain radiates to the groin.

EXAM:
MRI LUMBAR SPINE WITHOUT CONTRAST
TECHNIQUE: Multiplanar, multisequence MR imaging of the lumbar spine was
performed. No intravenous contrast was administered.

[Series 4: T1 · sagittal · 4.0mm · 0.55mm/px · 6 of 13 slices shown (1 of 2)]
[im 1/13]
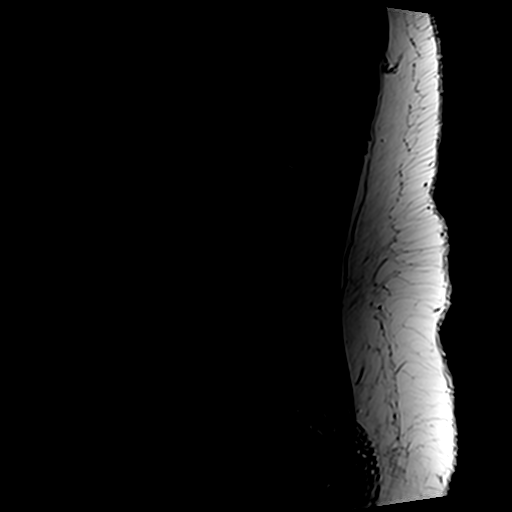
[im 3/13]
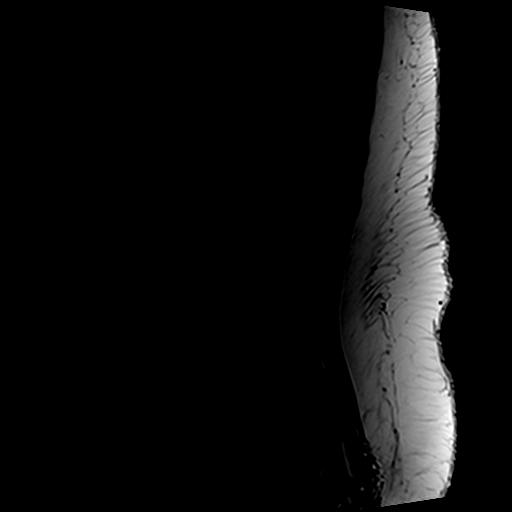
[im 5/13]
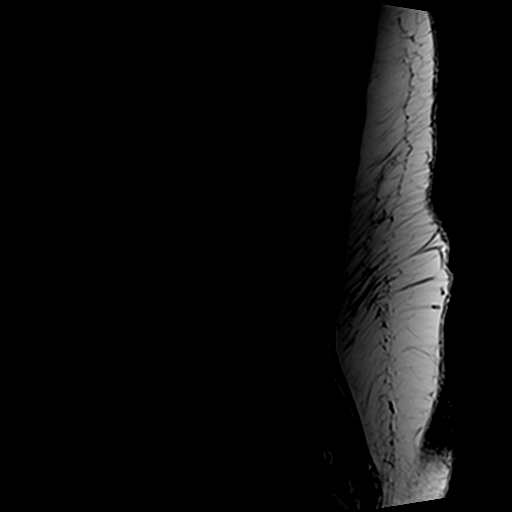
[im 8/13]
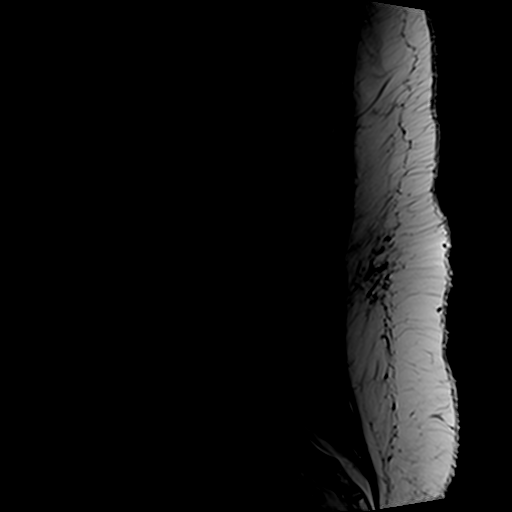
[im 10/13]
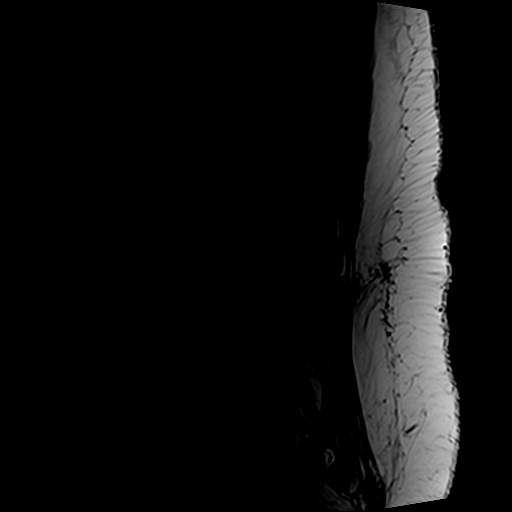
[im 13/13]
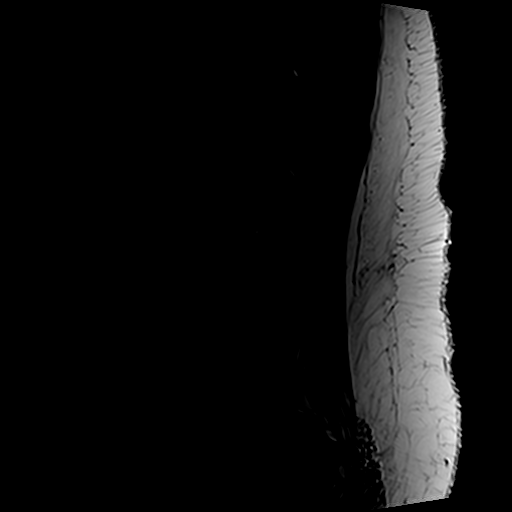

[Series 5: T2 post-contrast · sagittal · 4.0mm · 0.55mm/px · 6 of 13 slices shown]
[im 1/13]
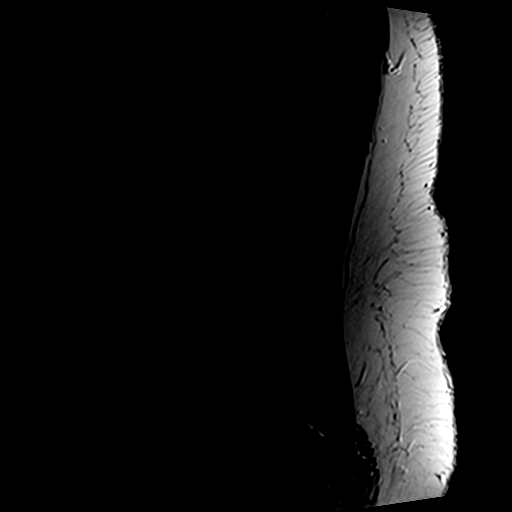
[im 3/13]
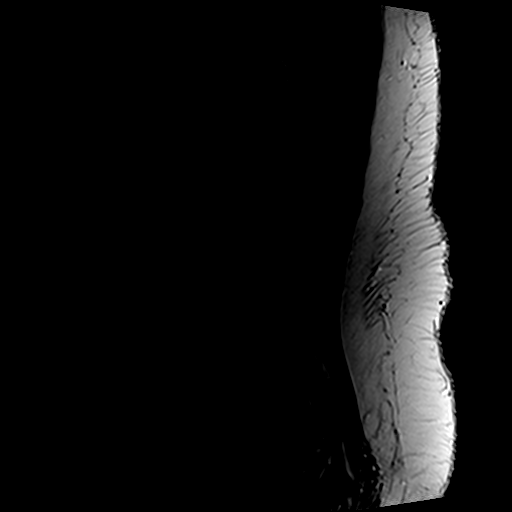
[im 5/13]
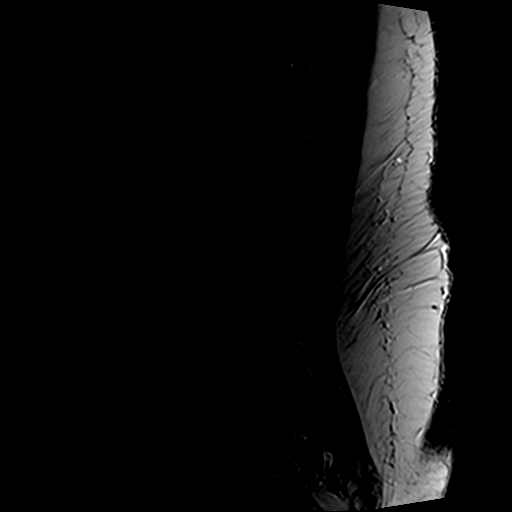
[im 8/13]
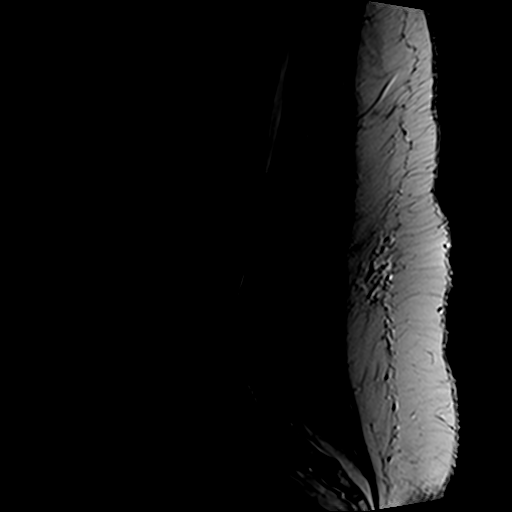
[im 10/13]
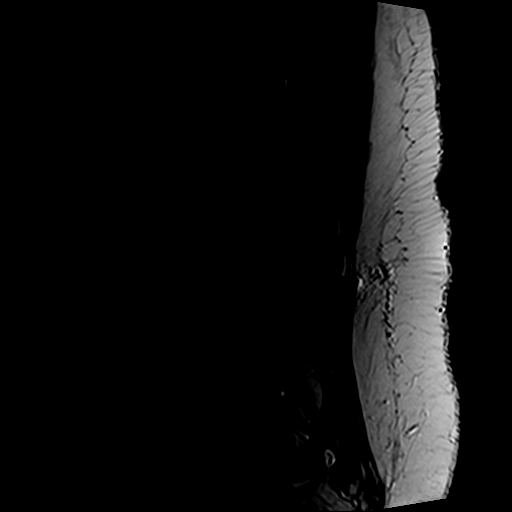
[im 13/13]
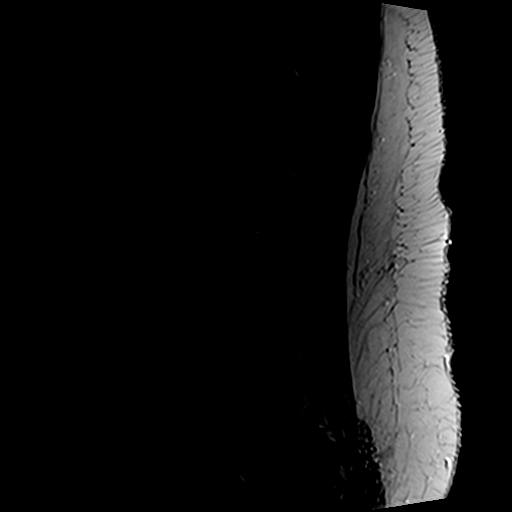

[Series 6: T2 · axial · 4.0mm · 0.74mm/px · z∈[-90,+89]mm · 9 of 33 slices shown]
[im 1/33]
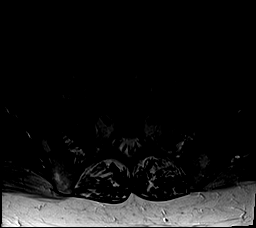
[im 5/33]
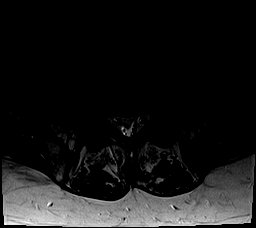
[im 10/33]
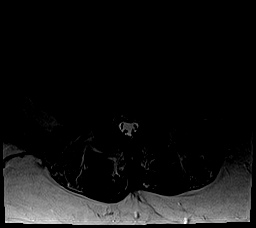
[im 14/33]
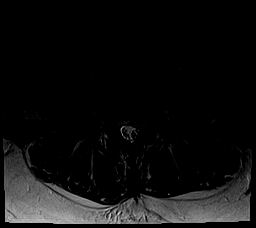
[im 17/33]
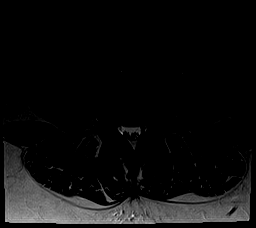
[im 19/33]
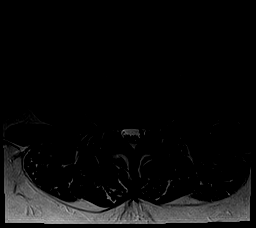
[im 23/33]
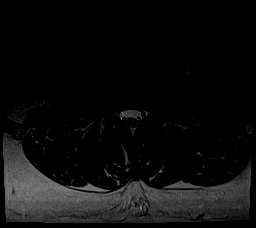
[im 28/33]
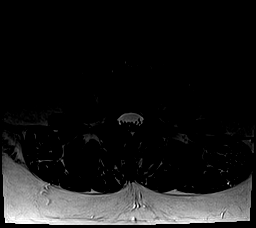
[im 33/33]
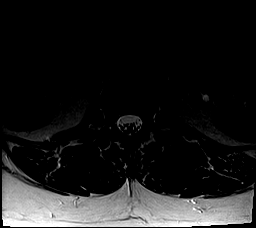

[Series 7: T1 · axial · 4.0mm · 0.37mm/px · z∈[-90,+64]mm · 5 of 33 slices shown (2 of 2)]
[im 1/33]
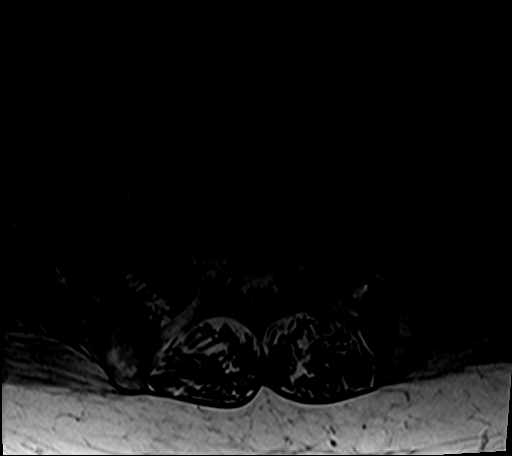
[im 5/33]
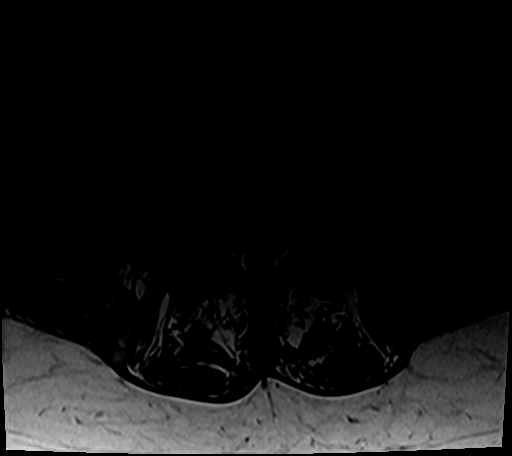
[im 10/33]
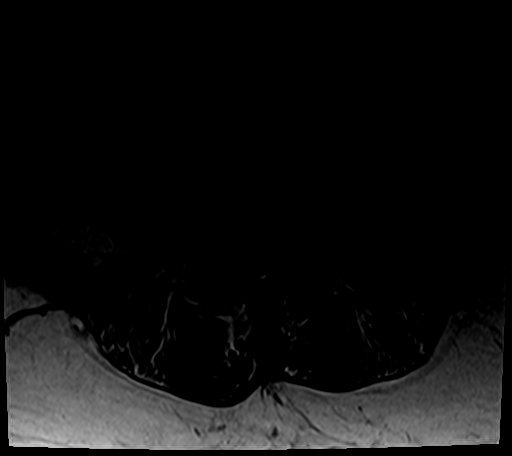
[im 17/33]
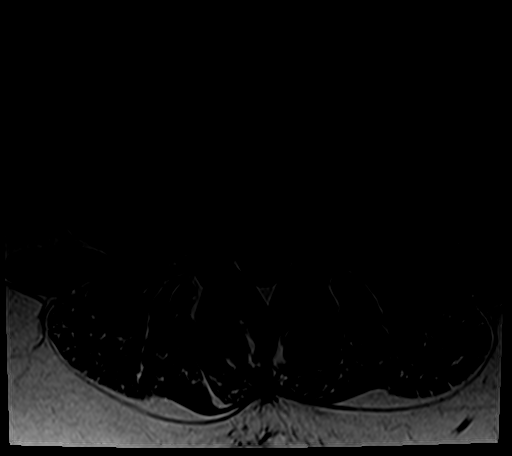
[im 28/33]
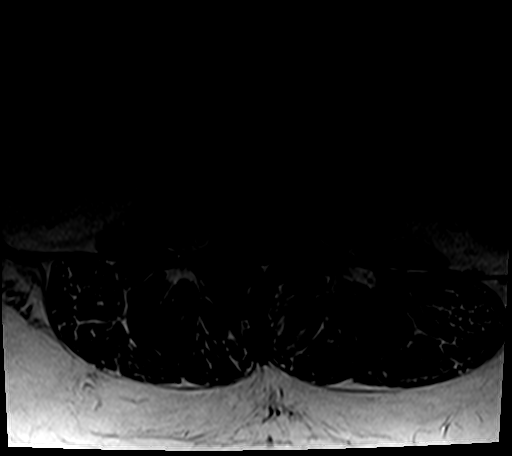

[26 of 48 positions shown; findings below may reference images not displayed]

FINDINGS: Segmentation: The lowest lumbar type non-rib-bearing vertebra is
labeled as L5.

Alignment:  Unremarkable

Vertebrae: No significant vertebral marrow edema is identified. Disc
desiccation at all levels between L2 and L5. Mildly congenitally
short pedicles.

Conus medullaris: Extends to the L1 level and appears normal.

Paraspinal and other soft tissues: Unremarkable

Disc levels: L1-2: Unremarkable.

L2-3: Mild left foraminal stenosis and mild displacement of the left
L2 nerve in the lateral extraforaminal space due to intervertebral
spurring, disc bulge, facet arthropathy, and left lateral
extraforaminal disc protrusion.

L3-4: Mild bilateral foraminal stenosis due to disc bulge and
intervertebral spurring.

L4-5: Moderate left eccentric central narrowing of the thecal sac
with mild to moderate left subarticular lateral recess stenosis due
to left paracentral disc protrusion superimposed on disc bulge.
Borderline bilateral foraminal stenosis.

L5-S1: Mild bilateral foraminal stenosis due to facet arthropathy
and intervertebral spurring. Mild disc bulge. Small synovial cyst
below the right facet joint, not in a position to cause impingement.
IMPRESSION: 1. Lumbar spondylosis, congenitally short pedicles, and degenerative
disc disease, causing moderate impingement at L4-5 eccentric to the
left, and mild impingement at L2- 3, L3-4, and L5-S1, as detailed
above.

## 2018-01-05 ENCOUNTER — Other Ambulatory Visit (INDEPENDENT_AMBULATORY_CARE_PROVIDER_SITE_OTHER): Payer: Self-pay | Admitting: Orthopaedic Surgery

## 2018-01-11 ENCOUNTER — Telehealth (INDEPENDENT_AMBULATORY_CARE_PROVIDER_SITE_OTHER): Payer: Self-pay

## 2018-01-11 ENCOUNTER — Other Ambulatory Visit (INDEPENDENT_AMBULATORY_CARE_PROVIDER_SITE_OTHER): Payer: Self-pay | Admitting: Orthopaedic Surgery

## 2018-01-11 ENCOUNTER — Encounter (INDEPENDENT_AMBULATORY_CARE_PROVIDER_SITE_OTHER): Payer: Self-pay | Admitting: Orthopaedic Surgery

## 2018-01-11 NOTE — Telephone Encounter (Signed)
xu patient 

## 2018-01-11 NOTE — Telephone Encounter (Signed)
Called Rx into pharm

## 2018-01-16 ENCOUNTER — Telehealth: Payer: Self-pay | Admitting: Adult Health

## 2018-01-16 NOTE — Telephone Encounter (Signed)
Patient dropped off a disability placard   Call patient for pick up at [phone]  Disposition: Dr's Folder

## 2018-01-17 NOTE — Telephone Encounter (Signed)
Called and left a message informing pt that paper work is available for pick up at the front desk.

## 2018-01-31 ENCOUNTER — Ambulatory Visit: Payer: BLUE CROSS/BLUE SHIELD | Admitting: Adult Health

## 2018-01-31 ENCOUNTER — Encounter: Payer: Self-pay | Admitting: Adult Health

## 2018-01-31 ENCOUNTER — Encounter: Payer: Self-pay | Admitting: Family Medicine

## 2018-01-31 VITALS — BP 120/80 | Temp 99.3°F | Wt 258.0 lb

## 2018-01-31 DIAGNOSIS — R6889 Other general symptoms and signs: Secondary | ICD-10-CM | POA: Diagnosis not present

## 2018-01-31 LAB — POC INFLUENZA A&B (BINAX/QUICKVUE)
INFLUENZA A, POC: POSITIVE — AB
INFLUENZA B, POC: NEGATIVE

## 2018-01-31 MED ORDER — HYDROCODONE-HOMATROPINE 5-1.5 MG/5ML PO SYRP
5.0000 mL | ORAL_SOLUTION | Freq: Three times a day (TID) | ORAL | 0 refills | Status: DC | PRN
Start: 2018-01-31 — End: 2018-04-19

## 2018-01-31 NOTE — Progress Notes (Signed)
Subjective:    Patient ID: Cheryl Stone, female    DOB: 1966/03/29, 52 y.o.   MRN: [id number]  Shortness of Breath  Associated symptoms include headaches and a sore throat. Pertinent negatives include no ear pain or wheezing.  Cough  Associated symptoms include headaches and a sore throat. Pertinent negatives include no ear pain, shortness of breath or wheezing.  Sore Throat   Associated symptoms include congestion, coughing and headaches. Pertinent negatives include no ear discharge, ear pain, shortness of breath or trouble swallowing.   Patient presents with nasal congestion that is green, non-productive cough, headache and body aches, chills  for the past 4 days.   She has also had fatigue and one episode of vomiting.  She has a poor appetite but is taking in fluids. She denies fever.  She has tried OTC Mucinex, lemon halls and honey and flonase.  She has been using a BC powder for headaches. She has an acetaminophen allergy and is limited in what she is able to take.    Review of Systems  HENT: Positive for congestion and sore throat. Negative for ear discharge, ear pain, sinus pressure, sinus pain, sneezing and trouble swallowing.   Respiratory: Positive for cough. Negative for chest tightness, shortness of breath and wheezing.   Cardiovascular: Negative.   Neurological: Positive for headaches.      Past Medical History:  Diagnosis Date  . Acid reflux   . Back pain   . Fluid retention   . Gallstones   . Graves disease    1999 had iodine radiation  . Graves disease    RAI ablation   . Hypertension   . Hypothyroidism   . Morbid obesity ([place])   . Osteoarthritis     Social History   Socioeconomic History  . Marital status: Single    Spouse name: Not on file  . Number of children: Not on file  . Years of education: Not on file  . Highest education level: Not on file  Social Needs  . Financial resource strain: Not on file  . Food insecurity - worry: Not on file  .  Food insecurity - inability: Not on file  . Transportation needs - medical: Not on file  . Transportation needs - non-medical: Not on file  Occupational History  . Not on file  Tobacco Use  . Smoking status: Never Smoker  . Smokeless tobacco: Never Used  Substance and Sexual Activity  . Alcohol use: Yes    Alcohol/week: 0.0 oz    Comment: once every 2 months  . Drug use: No  . Sexual activity: Not on file  Other Topics Concern  . 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     Past Surgical History:  Procedure Laterality Date  . CHOLECYSTECTOMY    . TUBAL LIGATION    . Uterine Ablation      Family History  Problem Relation Age of Onset  . Sudden Cardiac Death Mother        Saw her sister get killed, which led to heart attack  . Hypertension Maternal Grandmother   . Alzheimer's disease Maternal Grandfather   . Thyroid disease Paternal Grandmother   . Colon cancer Neg Hx     Allergies  Allergen Reactions  . Lobster [Shellfish Allergy] Rash    Lobster  . Tylenol [Acetaminophen] Hives and Swelling  Current Outpatient Medications on File Prior to Visit  Medication Sig Dispense Refill  . Cholecalciferol 5000 units capsule Take 1 capsule (5,000 Units total) by mouth daily. 90 capsule 2  . diclofenac (VOLTAREN) 75 MG EC tablet TAKE 1 TABLET BY MOUTH TWICE A DAY 30 tablet 2  . fluticasone (FLONASE) 50 MCG/ACT nasal spray Place 2 sprays into both nostrils daily. 16 g 6  . levothyroxine (SYNTHROID, LEVOTHROID) 175 MCG tablet TAKE 1 TABLET EVERY DAY AND TAKE AND EXTRA 1/2 TABLET ON SUNDAYS 102 tablet 3  . Lorcaserin HCl (BELVIQ) 10 MG TABS Take 10 mg by mouth 2 (two) times daily. 60 tablet 0  . traMADol (ULTRAM) 50 MG tablet 1-2 tabs po tid prn pain 30 tablet 0   No current facility-administered medications on file prior to visit.     BP 120/80   Temp 99.3 F (37.4 C)   Wt  258 lb (117 kg)   BMI 42.28 kg/m    Objective:   Physical Exam  Constitutional: She is oriented to person, place, and time. She appears well-developed and well-nourished. No distress.  HENT:  Right Ear: Tympanic membrane normal.  Left Ear: Tympanic membrane normal.  Nose: Mucosal edema and rhinorrhea present. Right sinus exhibits no maxillary sinus tenderness and no frontal sinus tenderness. Left sinus exhibits no maxillary sinus tenderness and no frontal sinus tenderness.  Mouth/Throat: Uvula is midline, oropharynx is clear and moist and mucous membranes are normal. No oropharyngeal exudate.  Inferior nasal turbinates are erythematous  Eyes: Conjunctivae are normal.  Cardiovascular: Normal rate, regular rhythm and normal heart sounds. Exam reveals no gallop and no friction rub.  No murmur heard. Pulmonary/Chest: Effort normal and breath sounds normal. No respiratory distress. She has no wheezes. She has no rales.  Lymphadenopathy:       Head (right side): No submental, no submandibular, no tonsillar, no preauricular and no posterior auricular adenopathy present.       Head (left side): No submental, no submandibular, no tonsillar, no preauricular and no posterior auricular adenopathy present.    She has no cervical adenopathy.  Neurological: She is alert and oriented to person, place, and time.  Skin: She is diaphoretic.  Nursing note and vitals reviewed.     Assessment & Plan:  1. Flu-like symptoms Symptomatic care continue flonase, mucinex and cough drops.  May use Motrin for fever or discomfort. Return precautions given to patient.  No longer eligible for Tamiflu.   - POC Influenza A&B(BINAX/QUICKVUE)  Cherilyn Sautter C Tyleigh Mahn BSN RN NP student

## 2018-01-31 NOTE — Progress Notes (Signed)
Subjective:    Patient ID: Cheryl Stone, female    DOB: 11-26-1966, 52 y.o.   MRN: [id number]  Influenza  This is a new problem. The current episode started in the past 7 days (3 days ). Associated symptoms include arthralgias, chills, congestion, coughing, fatigue, a fever, headaches, a sore throat, a visual change and weakness. Pertinent negatives include no diaphoresis, nausea or vertigo. Nothing aggravates the symptoms. She has tried NSAIDs and eating for the symptoms. The treatment provided mild relief.    Review of Systems  Constitutional: Positive for activity change, appetite change, chills, fatigue and fever. Negative for diaphoresis and unexpected weight change.  HENT: Positive for congestion, sinus pressure and sore throat. Negative for rhinorrhea and sinus pain.   Respiratory: Positive for cough. Negative for shortness of breath and wheezing.   Cardiovascular: Negative.   Gastrointestinal: Negative for nausea.  Musculoskeletal: Positive for arthralgias.  Neurological: Positive for weakness and headaches. Negative for vertigo.   Past Medical History:  Diagnosis Date  . Acid reflux   . Back pain   . Fluid retention   . Gallstones   . Graves disease    1999 had iodine radiation  . Graves disease    RAI ablation   . Hypertension   . Hypothyroidism   . Morbid obesity ([place])   . Osteoarthritis     Social History   Socioeconomic History  . Marital status: Single    Spouse name: Not on file  . Number of children: Not on file  . Years of education: Not on file  . Highest education level: Not on file  Social Needs  . Financial resource strain: Not on file  . Food insecurity - worry: Not on file  . Food insecurity - inability: Not on file  . Transportation needs - medical: Not on file  . Transportation needs - non-medical: Not on file  Occupational History  . Not on file  Tobacco Use  . Smoking status: Never Smoker  . Smokeless tobacco: Never Used  Substance and  Sexual Activity  . Alcohol use: Yes    Alcohol/week: 0.0 oz    Comment: once every 2 months  . Drug use: No  . Sexual activity: Not on file  Other Topics Concern  . 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     Past Surgical History:  Procedure Laterality Date  . CHOLECYSTECTOMY    . TUBAL LIGATION    . Uterine Ablation      Family History  Problem Relation Age of Onset  . Sudden Cardiac Death Mother        Saw her sister get killed, which led to heart attack  . Hypertension Maternal Grandmother   . Alzheimer's disease Maternal Grandfather   . Thyroid disease Paternal Grandmother   . Colon cancer Neg Hx     Allergies  Allergen Reactions  . Lobster [Shellfish Allergy] Rash    Lobster  . Tylenol [Acetaminophen] Hives and Swelling    Current Outpatient Medications on File Prior to Visit  Medication Sig Dispense Refill  . Cholecalciferol 5000 units capsule Take 1 capsule (5,000 Units total) by mouth daily. 90 capsule 2  . diclofenac (VOLTAREN) 75 MG EC tablet TAKE 1 TABLET BY MOUTH TWICE A DAY 30 tablet 2  . fluticasone (FLONASE) 50 MCG/ACT nasal spray Place 2 sprays into both nostrils  daily. 16 g 6  . levothyroxine (SYNTHROID, LEVOTHROID) 175 MCG tablet TAKE 1 TABLET EVERY DAY AND TAKE AND EXTRA 1/2 TABLET ON SUNDAYS 102 tablet 3  . Lorcaserin HCl (BELVIQ) 10 MG TABS Take 10 mg by mouth 2 (two) times daily. 60 tablet 0  . traMADol (ULTRAM) 50 MG tablet 1-2 tabs po tid prn pain 30 tablet 0   No current facility-administered medications on file prior to visit.     BP 120/80   Temp 99.3 F (37.4 C)   Wt 258 lb (117 kg)   BMI 42.28 kg/m       Objective:   Physical Exam  Constitutional: She is oriented to person, place, and time. She appears well-developed and well-nourished. No distress.  HENT:  Head: Normocephalic and atraumatic.  Right Ear: External ear normal.    Left Ear: External ear normal.  Nose: Nose normal.  Mouth/Throat: Oropharynx is clear and moist. No oropharyngeal exudate.  Eyes: Conjunctivae and EOM are normal. Pupils are equal, round, and reactive to light. Right eye exhibits no discharge. Left eye exhibits no discharge. No scleral icterus.  Cardiovascular: Normal rate, regular rhythm, normal heart sounds and intact distal pulses. Exam reveals no gallop and no friction rub.  No murmur heard. Pulmonary/Chest: Effort normal and breath sounds normal. No respiratory distress. She has no wheezes. She has no rales. She exhibits no tenderness.  Abdominal: Soft. Bowel sounds are normal. She exhibits no distension and no mass. There is no tenderness. There is no rebound and no guarding.  Neurological: She is alert and oriented to person, place, and time.  Skin: Skin is warm and dry. No rash noted. She is not diaphoretic. No erythema. No pallor.  Psychiatric: She has a normal mood and affect. Her behavior is normal. Judgment and thought content normal.  Nursing note and vitals reviewed.     Assessment & Plan:  1. Flu-like symptoms - positive for flu A. She is outside the window of Tamiflu. Will previdie Hycodan at night to help her sleep due to cough. Advised conservative measures. Rest, and hydration  Return precautions reviewed  - POC Influenza A&B(BINAX/QUICKVUE) - HYDROcodone-homatropine (HYCODAN) 5-1.5 MG/5ML syrup; Take 5 mLs by mouth every 8 (eight) hours as needed for cough.  Dispense: 120 mL; Refill: 0  Dorothyann Peng, NP

## 2018-02-04 ENCOUNTER — Other Ambulatory Visit (INDEPENDENT_AMBULATORY_CARE_PROVIDER_SITE_OTHER): Payer: Self-pay | Admitting: Orthopaedic Surgery

## 2018-02-05 MED ORDER — TRAMADOL HCL 50 MG PO TABS: ORAL_TABLET | ORAL | 0 refills | Status: DC

## 2018-02-05 NOTE — Telephone Encounter (Signed)
Please advise 

## 2018-02-05 NOTE — Telephone Encounter (Signed)
Done. Patient aware Rx has been called in. 

## 2018-03-09 ENCOUNTER — Other Ambulatory Visit (INDEPENDENT_AMBULATORY_CARE_PROVIDER_SITE_OTHER): Payer: Self-pay | Admitting: Physician Assistant

## 2018-03-09 NOTE — Telephone Encounter (Signed)
ok 

## 2018-03-09 NOTE — Telephone Encounter (Signed)
States she would like to hold off

## 2018-03-09 NOTE — Telephone Encounter (Signed)
Ok to refill as long as patient states this is ok to take with history of gastric sleeve surgery

## 2018-03-10 ENCOUNTER — Other Ambulatory Visit (INDEPENDENT_AMBULATORY_CARE_PROVIDER_SITE_OTHER): Payer: Self-pay | Admitting: Orthopaedic Surgery

## 2018-03-12 NOTE — Telephone Encounter (Signed)
yes

## 2018-03-12 NOTE — Telephone Encounter (Signed)
xu patient 

## 2018-03-14 ENCOUNTER — Telehealth (INDEPENDENT_AMBULATORY_CARE_PROVIDER_SITE_OTHER): Payer: Self-pay | Admitting: Orthopaedic Surgery

## 2018-03-14 MED ORDER — TRAMADOL HCL 50 MG PO TABS: ORAL_TABLET | ORAL | 0 refills | Status: DC

## 2018-03-14 NOTE — Telephone Encounter (Signed)
Patient called needing Rx refilled (Tramadol) Patient asked [facility] the [facility] [place] [address] [place] [address]  The phone  # is [phone]

## 2018-03-14 NOTE — Telephone Encounter (Signed)
Called Rx into pharm

## 2018-03-14 NOTE — Telephone Encounter (Signed)
Rx was called into pharm 

## 2018-04-08 ENCOUNTER — Other Ambulatory Visit: Payer: Self-pay | Admitting: Endocrinology

## 2018-04-13 ENCOUNTER — Other Ambulatory Visit (INDEPENDENT_AMBULATORY_CARE_PROVIDER_SITE_OTHER): Payer: BLUE CROSS/BLUE SHIELD

## 2018-04-13 DIAGNOSIS — E890 Postprocedural hypothyroidism: Secondary | ICD-10-CM

## 2018-04-13 LAB — TSH: TSH: 2.17 u[IU]/mL (ref 0.35–4.50)

## 2018-04-13 LAB — T4, FREE: FREE T4: 0.83 ng/dL (ref 0.60–1.60)

## 2018-04-16 ENCOUNTER — Other Ambulatory Visit: Payer: BLUE CROSS/BLUE SHIELD

## 2018-04-19 ENCOUNTER — Ambulatory Visit: Payer: BLUE CROSS/BLUE SHIELD | Admitting: Endocrinology

## 2018-04-19 ENCOUNTER — Encounter: Payer: Self-pay | Admitting: Endocrinology

## 2018-04-19 ENCOUNTER — Other Ambulatory Visit (INDEPENDENT_AMBULATORY_CARE_PROVIDER_SITE_OTHER): Payer: Self-pay | Admitting: Orthopaedic Surgery

## 2018-04-19 VITALS — BP 118/82 | HR 68 | Ht 65.5 in | Wt 268.6 lb

## 2018-04-19 DIAGNOSIS — E890 Postprocedural hypothyroidism: Secondary | ICD-10-CM

## 2018-04-19 NOTE — Telephone Encounter (Signed)
Xu patient

## 2018-04-19 NOTE — Progress Notes (Signed)
Right in April of last year When it was patient ID: Cheryl Stone, female   DOB: 01-30-1966, 52 y.o.   MRN: [id number]            Reason for Appointment:  Hypothyroidism, follow-up visit    History of Present Illness:    Hypothyroidism was first diagnosed in 03/1998  At that time patient was having symptoms of  fatigue, cold sensitivity and weight loss. She was found to be hyperthyroid and she was also having significant difficulty swallowing. She was told that her thyroid was the size of a golf ball and she was having Graves' disease She was given radioactive iodine  Subsequently her labs showed that she was hypothyroid and she was started on thyroid supplements.  At that time she was feeling fairly well. She was followed regularly before she moved here and apparently her levothyroxine dose has been increased stepwise About 2 years ago she was taking 200 g and subsequently was on 224 g daily Subsequently has required lower doses with the last change made in 5/17 on her last visit  Recent history:   She was feeling fairly good with her follow-up visit in 03/2017 but because of her low normal TSH her dosage was reduced by half tablet weekly   Since then she has been taking 175 g levothyroxine, 7 tablets per week She does feel fairly good, no unusual fatigue recently Her weight has gone up but because of not taking her weight loss medication No cold intolerance or dry skin  TSH level is now consistently normal at 2.2, previously was 1.7            Patient's weight history is as follows:  Wt Readings from Last 3 Encounters:  04/19/18 268 lb 9.6 oz (121.8 kg)  01/31/18 258 lb (117 kg)  10/16/17 265 lb (120.2 kg)    Thyroid function results have been as follows:  Lab Results  Component Value Date   TSH 2.17 04/13/2018   TSH 1.73 10/09/2017   TSH 0.57 03/23/2017   FREET4 0.83 04/13/2018   FREET4 1.03 10/09/2017   FREET4 1.05 03/23/2017     THYROID nodule: She had a  thyroid ultrasound in 2015 by another physician  This showed a mostly solid 1.5 cm nodule on the right side.    Needle aspiration in 3/17 showed benign follicular nodule    Past Medical History:  Diagnosis Date  . Acid reflux   . Back pain   . Fluid retention   . Gallstones   . Graves disease    1999 had iodine radiation  . Graves disease    RAI ablation   . Hypertension   . Hypothyroidism   . Morbid obesity ([place])   . Osteoarthritis     Past Surgical History:  Procedure Laterality Date  . CHOLECYSTECTOMY    . TUBAL LIGATION    . Uterine Ablation      Family History  Problem Relation Age of Onset  . Sudden Cardiac Death Mother        Saw her sister get killed, which led to heart attack  . Hypertension Maternal Grandmother   . Alzheimer's disease Maternal Grandfather   . Thyroid disease Paternal Grandmother   . Colon cancer Neg Hx     Social History:  reports that she has never smoked. She has never used smokeless tobacco. She reports that she drinks alcohol. She reports that she does not use drugs.  Allergies:  Allergies  Allergen Reactions  .  Lobster [Shellfish Allergy] Rash    Lobster  . Tylenol [Acetaminophen] Hives and Swelling    Allergies as of 04/19/2018      Reactions   Lobster [shellfish Allergy] Rash   Lobster   Tylenol [acetaminophen] Hives, Swelling      Medication List        Accurate as of 04/19/18  3:59 PM. Always use your most recent med list.          Cholecalciferol 5000 units capsule Take 1 capsule (5,000 Units total) by mouth daily.   fluticasone 50 MCG/ACT nasal spray Commonly known as:  FLONASE Place 2 sprays into both nostrils daily.   levothyroxine 175 MCG tablet Commonly known as:  SYNTHROID, LEVOTHROID TAKE 1 TABLET EVERY DAY AND TAKE AND EXTRA 1/2 TABLET ON SUNDAYS   Lorcaserin HCl 10 MG Tabs Commonly known as:  BELVIQ Take 10 mg by mouth 2 (two) times daily.   traMADol 50 MG tablet Commonly known as:   ULTRAM 1-2 tabs po tid prn pain         Review of Systems    She has been prescribed Belviq but has not been taking this recently She thinks that this helps her weight and also tendency to edema She is going to follow-up with her PCP soon to get another prescription             Examination:    BP 118/82 (BP Location: Left Arm, Patient Position: Sitting, Cuff Size: Normal)   Pulse 68   Ht 5' 5.5" (1.664 m)   Wt 268 lb 9.6 oz (121.8 kg)   SpO2 97%   BMI 44.02 kg/m   Thyroid not palpable Biceps reflexes normal Skin appears normal  Assessment:  HYPOTHYROIDISM, post radioactive iodine treatment, reportedly for Graves' disease  She has required lower doses of supplementation since 2017 Now taking 175 g, 1 tablet daily for about a year now She feels fairly good subjectively She is taking her levothyroxine consistently as directed  TSH is consistently back to normal  History of thyroid nodule: This is not palpable clinically, previously was benign anyway   PLAN:   She will continue 175 g, take 1 tablet daily She can take her medication about 30 minutes before eating rather than 60  Follow-up in 12 months   Marnell Mcdaniel 04/19/2018, 3:59 PM

## 2018-04-22 NOTE — Telephone Encounter (Signed)
yes

## 2018-04-23 ENCOUNTER — Other Ambulatory Visit: Payer: Self-pay | Admitting: Adult Health

## 2018-04-23 DIAGNOSIS — J301 Allergic rhinitis due to pollen: Secondary | ICD-10-CM

## 2018-04-24 ENCOUNTER — Other Ambulatory Visit (INDEPENDENT_AMBULATORY_CARE_PROVIDER_SITE_OTHER): Payer: Self-pay

## 2018-04-24 MED ORDER — TRAMADOL HCL 50 MG PO TABS: ORAL_TABLET | ORAL | 0 refills | Status: DC

## 2018-04-24 NOTE — Telephone Encounter (Signed)
Called into pharm  

## 2018-04-24 NOTE — Telephone Encounter (Signed)
Sent to the pharmacy by e-scribe. 

## 2018-04-25 ENCOUNTER — Telehealth: Payer: Self-pay | Admitting: Adult Health

## 2018-04-25 MED ORDER — HYDROCHLOROTHIAZIDE 25 MG PO TABS: ORAL_TABLET | ORAL | 3 refills | Status: DC

## 2018-04-25 NOTE — Telephone Encounter (Signed)
Pt requesting refill of Hydrochlorothiazide 25mg  tab. Not on current med list but was prescribed for pt previously.     LOV:01/31/18 Cory Nafziger,NP  [facility] [place]

## 2018-04-25 NOTE — Telephone Encounter (Signed)
Sent to local pharmacy.

## 2018-04-25 NOTE — Telephone Encounter (Signed)
Called and spoke to the pt.  She states she takes hctz prn for swelling.  States her swelling is worse than usual.  Would like a refill.  Last filled 01/31/17.  Will send to [facility] for authorization.

## 2018-04-25 NOTE — Telephone Encounter (Signed)
Copied from [place]. Topic: Quick Communication - See Telephone Encounter >> Apr 25, 2018  8:54 AM Conception Chancy, NT wrote: CRM for notification. See Telephone encounter for: 04/25/18.  Patient is calling and states she would like a refill on hydrochlorothiazide (HYDRODIURIL) 25 MG tablet. Please advise.   [id number] [place] [place] - [address] AT [place] [address] Phone: [phone] Fax: [phone]

## 2018-04-26 MED ORDER — HYDROCHLOROTHIAZIDE 25 MG PO TABS: ORAL_TABLET | ORAL | 3 refills | Status: DC

## 2018-04-26 NOTE — Addendum Note (Signed)
Addended by: Miles Costain T on: 04/26/2018 07:38 AM   Modules accepted: Orders

## 2018-04-26 NOTE — Telephone Encounter (Signed)
Redirected to correct pharmacy.  Will close.

## 2018-05-11 ENCOUNTER — Ambulatory Visit: Payer: BLUE CROSS/BLUE SHIELD | Admitting: Adult Health

## 2018-05-11 DIAGNOSIS — Z0289 Encounter for other administrative examinations: Secondary | ICD-10-CM

## 2018-05-16 ENCOUNTER — Ambulatory Visit: Payer: BLUE CROSS/BLUE SHIELD | Admitting: Adult Health

## 2018-06-19 ENCOUNTER — Telehealth (INDEPENDENT_AMBULATORY_CARE_PROVIDER_SITE_OTHER): Payer: Self-pay | Admitting: Orthopaedic Surgery

## 2018-06-20 NOTE — Telephone Encounter (Signed)
We can refill it one more time.  #30.

## 2018-06-21 MED ORDER — TRAMADOL HCL 50 MG PO TABS: ORAL_TABLET | ORAL | 0 refills | Status: DC

## 2018-06-29 NOTE — Telephone Encounter (Signed)
Pt called to check on the status of med

## 2018-07-02 ENCOUNTER — Encounter: Payer: Self-pay | Admitting: Adult Health

## 2018-07-02 ENCOUNTER — Other Ambulatory Visit (INDEPENDENT_AMBULATORY_CARE_PROVIDER_SITE_OTHER): Payer: Self-pay | Admitting: Orthopaedic Surgery

## 2018-07-02 MED ORDER — TRAMADOL HCL 50 MG PO TABS: ORAL_TABLET | ORAL | 0 refills | Status: DC

## 2018-07-02 NOTE — Telephone Encounter (Signed)
Ok to refill once more per dr.xu previous message.  #30

## 2018-07-02 NOTE — Telephone Encounter (Signed)
Please advise 

## 2018-07-03 ENCOUNTER — Telehealth (INDEPENDENT_AMBULATORY_CARE_PROVIDER_SITE_OTHER): Payer: Self-pay | Admitting: Orthopaedic Surgery

## 2018-07-03 ENCOUNTER — Other Ambulatory Visit (INDEPENDENT_AMBULATORY_CARE_PROVIDER_SITE_OTHER): Payer: Self-pay

## 2018-07-03 MED ORDER — TRAMADOL HCL 50 MG PO TABS: ORAL_TABLET | ORAL | 0 refills | Status: DC

## 2018-07-03 NOTE — Telephone Encounter (Signed)
[place] from [place] called left voicemail message to call back with complete directions for Rx (Tramadol) and patient's DOB. The number to contact [place] is [phone]

## 2018-07-03 NOTE — Telephone Encounter (Signed)
Called into pharm  

## 2018-07-03 NOTE — Telephone Encounter (Signed)
Called them to advise on what was missing.

## 2018-07-05 ENCOUNTER — Telehealth: Payer: Self-pay | Admitting: Adult Health

## 2018-07-05 NOTE — Telephone Encounter (Signed)
Copied from [place] [id number]. Topic: Quick Communication - See Telephone Encounter >> Jul 05, 2018  4:33 PM Cheryl Stone wrote: Iron pills - pt needing a refill of them if possible.  Pt will come in if needed to have them filled.

## 2018-07-06 NOTE — Telephone Encounter (Signed)
Spoke to the pt and she is now scheduled for CPX on 08/08/18 @ 11:30.

## 2018-08-08 ENCOUNTER — Encounter: Payer: BLUE CROSS/BLUE SHIELD | Admitting: Adult Health

## 2018-08-09 ENCOUNTER — Telehealth (INDEPENDENT_AMBULATORY_CARE_PROVIDER_SITE_OTHER): Payer: Self-pay | Admitting: Orthopaedic Surgery

## 2018-08-09 NOTE — Telephone Encounter (Signed)
Please advise 

## 2018-08-09 NOTE — Telephone Encounter (Signed)
30

## 2018-08-09 NOTE — Telephone Encounter (Signed)
Patient called needing a refill on Rx (Tramadol) the number to contact patient is [phone]

## 2018-08-10 ENCOUNTER — Other Ambulatory Visit (INDEPENDENT_AMBULATORY_CARE_PROVIDER_SITE_OTHER): Payer: Self-pay

## 2018-08-10 MED ORDER — TRAMADOL HCL 50 MG PO TABS: ORAL_TABLET | ORAL | 0 refills | Status: DC

## 2018-08-10 NOTE — Telephone Encounter (Signed)
Called into pharm. Patient aware.  

## 2018-08-16 ENCOUNTER — Telehealth: Payer: Self-pay | Admitting: Adult Health

## 2018-08-16 ENCOUNTER — Ambulatory Visit (INDEPENDENT_AMBULATORY_CARE_PROVIDER_SITE_OTHER): Payer: BLUE CROSS/BLUE SHIELD | Admitting: Adult Health

## 2018-08-16 ENCOUNTER — Encounter: Payer: Self-pay | Admitting: Adult Health

## 2018-08-16 VITALS — BP 140/80 | Temp 97.9°F | Ht 66.0 in | Wt 281.0 lb

## 2018-08-16 DIAGNOSIS — Z23 Encounter for immunization: Secondary | ICD-10-CM

## 2018-08-16 DIAGNOSIS — E039 Hypothyroidism, unspecified: Secondary | ICD-10-CM | POA: Diagnosis not present

## 2018-08-16 DIAGNOSIS — Z0000 Encounter for general adult medical examination without abnormal findings: Secondary | ICD-10-CM | POA: Diagnosis not present

## 2018-08-16 DIAGNOSIS — I10 Essential (primary) hypertension: Secondary | ICD-10-CM | POA: Diagnosis not present

## 2018-08-16 LAB — LIPID PANEL
Cholesterol: 216 mg/dL — ABNORMAL HIGH (ref 0–200)
HDL: 64.8 mg/dL (ref >39.00)
LDL CALC: 138 mg/dL — AB (ref 0–99)
NONHDL: 151.42
Total CHOL/HDL Ratio: 3
Triglycerides: 65 mg/dL (ref 0.0–149.0)
VLDL: 13 mg/dL (ref 0.0–40.0)

## 2018-08-16 LAB — CBC WITH DIFFERENTIAL/PLATELET
Basophils Absolute: 0 10*3/uL (ref 0.0–0.1)
Basophils Relative: 0.3 % (ref 0.0–3.0)
EOS ABS: 0.1 10*3/uL (ref 0.0–0.7)
EOS PCT: 2.1 % (ref 0.0–5.0)
HCT: 43 % (ref 36.0–46.0)
HEMOGLOBIN: 14.3 g/dL (ref 12.0–15.0)
Lymphocytes Relative: 45.8 % (ref 12.0–46.0)
Lymphs Abs: 1.8 10*3/uL (ref 0.7–4.0)
MCHC: 33.3 g/dL (ref 30.0–36.0)
MCV: 87 fl (ref 78.0–100.0)
MONO ABS: 0.3 10*3/uL (ref 0.1–1.0)
Monocytes Relative: 8 % (ref 3.0–12.0)
Neutro Abs: 1.7 10*3/uL (ref 1.4–7.7)
Neutrophils Relative %: 43.8 % (ref 43.0–77.0)
Platelets: 174 10*3/uL (ref 150.0–400.0)
RBC: 4.94 Mil/uL (ref 3.87–5.11)
RDW: 16.2 % — ABNORMAL HIGH (ref 11.5–15.5)
WBC: 3.9 10*3/uL — AB (ref 4.0–10.5)

## 2018-08-16 LAB — HEPATIC FUNCTION PANEL
ALK PHOS: 56 U/L (ref 39–117)
ALT: 20 U/L (ref 0–35)
AST: 13 U/L (ref 0–37)
Albumin: 3.8 g/dL (ref 3.5–5.2)
BILIRUBIN DIRECT: 0.1 mg/dL (ref 0.0–0.3)
BILIRUBIN TOTAL: 0.5 mg/dL (ref 0.2–1.2)
Total Protein: 6.9 g/dL (ref 6.0–8.3)

## 2018-08-16 LAB — HEMOGLOBIN A1C: Hgb A1c MFr Bld: 5.8 % (ref 4.6–6.5)

## 2018-08-16 LAB — BASIC METABOLIC PANEL
BUN: 16 mg/dL (ref 6–23)
CO2: 31 mEq/L (ref 19–32)
CREATININE: 0.88 mg/dL (ref 0.40–1.20)
Calcium: 9.1 mg/dL (ref 8.4–10.5)
Chloride: 101 mEq/L (ref 96–112)
GFR: 86.16 mL/min (ref >60.00)
Glucose, Bld: 74 mg/dL (ref 70–99)
POTASSIUM: 3.8 meq/L (ref 3.5–5.1)
Sodium: 140 mEq/L (ref 135–145)

## 2018-08-16 LAB — TSH: TSH: 13 u[IU]/mL — AB (ref 0.35–4.50)

## 2018-08-16 MED ORDER — AMLODIPINE BESYLATE 2.5 MG PO TABS: 2.5000 mg | ORAL_TABLET | Freq: Every day | ORAL | 3 refills | Status: DC

## 2018-08-16 NOTE — Progress Notes (Signed)
Subjective:    Patient ID: Cheryl Stone, female    DOB: 07-11-1966, 52 y.o.   MRN: [id number]  HPI Patient presents for yearly preventative medicine examination. She is a pleasant 52 year old female who  has a past medical history of Acid reflux, Back pain, Fluid retention, Gallstones, Graves disease, Graves disease, Hypertension, Hypothyroidism, Morbid obesity ([place]), and Osteoarthritis.   She has moved to [place] for her husbands job. She is going to continue to use [place] has her PCP.   Hypothyroidism - Takes synthroid 175 mcg. She is seen by Dr. Dwyane Dee.  Lab Results  Component Value Date   TSH 2.17 04/13/2018   Hypertension - Takes HCTZ 25 mg PRN for swelling.  BP Readings from Last 3 Encounters:  08/16/18 140/80  04/19/18 118/82  01/31/18 120/80   Obesity - Is followed by [facility].  Wt Readings from Last 3 Encounters:  08/16/18 281 lb (127.5 kg)  04/19/18 268 lb 9.6 oz (121.8 kg)  01/31/18 258 lb (117 kg)    All immunizations and health maintenance protocols were reviewed with the patient and needed orders were placed. She is due for an influenza vaccination.   Appropriate screening laboratory values were ordered for the patient including screening of hyperlipidemia, renal function and hepatic function.  Medication reconciliation,  past medical history, social history, problem list and allergies were reviewed in detail with the patient  Goals were established with regard to weight loss, exercise, and  diet in compliance with medications.   She is due for a mammogram and pap. She is up to date on her colonoscopy. She is going to look for a GYN in [place].    Review of Systems  Constitutional: Negative.   HENT: Negative.   Eyes: Negative.   Respiratory: Negative.   Cardiovascular: Negative.   Gastrointestinal: Negative.   Endocrine: Negative.   Genitourinary: Negative.   Musculoskeletal: Positive for arthralgias.  Skin: Negative.     Allergic/Immunologic: Negative.   Neurological: Negative.   Hematological: Negative.   Psychiatric/Behavioral: Negative.    Past Medical History:  Diagnosis Date  . Acid reflux   . Back pain   . Fluid retention   . Gallstones   . Graves disease    1999 had iodine radiation  . Graves disease    RAI ablation   . Hypertension   . Hypothyroidism   . Morbid obesity ([place])   . Osteoarthritis     Social History   Socioeconomic History  . Marital status: Single    Spouse name: Not on file  . Number of children: Not on file  . Years of education: Not on file  . Highest education level: Not on file  Occupational History  . Not on file  Social Needs  . Financial resource strain: Not on file  . Food insecurity:    Worry: Not on file    Inability: Not on file  . Transportation needs:    Medical: Not on file    Non-medical: Not on file  Tobacco Use  . Smoking status: Never Smoker  . Smokeless tobacco: Never Used  Substance and Sexual Activity  . Alcohol use: Yes    Alcohol/week: 0.0 standard drinks    Comment: once every 2 months  . Drug use: No  . Sexual activity: Not on file  Lifestyle  . Physical activity:    Days per week: Not on file    Minutes per session: Not on file  . Stress: Not  on file  Relationships  . Social connections:    Talks on phone: Not on file    Gets together: Not on file    Attends religious service: Not on file    Active member of club or organization: Not on file    Attends meetings of clubs or organizations: Not on file    Relationship status: Not on file  . Intimate partner violence:    Fear of current or ex partner: Not on file    Emotionally abused: Not on file    Physically abused: Not on file    Forced sexual activity: Not on file  Other Topics Concern  . 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     Past Surgical History:   Procedure Laterality Date  . CHOLECYSTECTOMY    . TUBAL LIGATION    . Uterine Ablation      Family History  Problem Relation Age of Onset  . Sudden Cardiac Death Mother        Saw her sister get killed, which led to heart attack  . Hypertension Maternal Grandmother   . Alzheimer's disease Maternal Grandfather   . Thyroid disease Paternal Grandmother   . Colon cancer Neg Hx     Allergies  Allergen Reactions  . Lobster [Shellfish Allergy] Rash    Lobster  . Tylenol [Acetaminophen] Hives and Swelling    Current Outpatient Medications on File Prior to Visit  Medication Sig Dispense Refill  . Cholecalciferol 5000 units capsule Take 1 capsule (5,000 Units total) by mouth daily. 90 capsule 2  . diclofenac (VOLTAREN) 75 MG EC tablet Take by mouth.    . fluticasone (FLONASE) 50 MCG/ACT nasal spray SPRAY 2 SPRAYS INTO EACH NOSTRIL EVERY DAY 48 g 1  . hydrochlorothiazide (HYDRODIURIL) 25 MG tablet Take daily as needed for edema 30 tablet 3  . levothyroxine (SYNTHROID, LEVOTHROID) 175 MCG tablet TAKE 1 TABLET EVERY DAY AND TAKE AND EXTRA 1/2 TABLET ON SUNDAYS (Patient taking differently: TAKE 1 TABLET EVERY DAY.) 102 tablet 3  . Lorcaserin HCl (BELVIQ) 10 MG TABS Take 10 mg by mouth 2 (two) times daily. 60 tablet 0  . methylPREDNISolone (MEDROL DOSEPAK) 4 MG TBPK tablet Medrol Dosepak.  Follow package directions.    . traMADol (ULTRAM) 50 MG tablet 1-2 tabs po tid prn pain 30 tablet 0   No current facility-administered medications on file prior to visit.     BP 140/80 (BP Location: Left Wrist)   Temp 97.9 F (36.6 C) (Oral)   Ht 5\' 6"  (1.676 m)   Wt 281 lb (127.5 kg)   BMI 45.35 kg/m       Objective:   Physical Exam  Constitutional: She is oriented to person, place, and time. She appears well-developed and well-nourished. No distress.  Obese   HENT:  Head: Normocephalic and atraumatic.  Right Ear: External ear normal.  Left Ear: External ear normal.  Nose: Nose normal.   Mouth/Throat: Oropharynx is clear and moist. No oropharyngeal exudate.  Eyes: Pupils are equal, round, and reactive to light. Conjunctivae and EOM are normal. Right eye exhibits no discharge. Left eye exhibits no discharge. No scleral icterus.  Neck: Normal range of motion. Neck supple. No JVD present. No tracheal deviation present. No thyromegaly present.  Cardiovascular: Normal rate, regular rhythm, normal heart sounds and intact distal pulses. Exam reveals no  gallop and no friction rub.  No murmur heard. Pulmonary/Chest: Effort normal and breath sounds normal. No stridor. No respiratory distress. She has no wheezes. She has no rales. She exhibits no tenderness.  Abdominal: Soft. Bowel sounds are normal. She exhibits no distension and no mass. There is no tenderness. There is no rebound and no guarding. No hernia.  Genitourinary:  Genitourinary Comments: Will have done by GYN   Musculoskeletal: Normal range of motion. She exhibits tenderness (right shoulder and bilateral legs ). She exhibits no edema or deformity.  Lymphadenopathy:    She has no cervical adenopathy.  Neurological: She is alert and oriented to person, place, and time. She displays normal reflexes. No cranial nerve deficit or sensory deficit. She exhibits normal muscle tone. Coordination normal.  Skin: Skin is warm and dry. Capillary refill takes less than 2 seconds. No rash noted. She is not diaphoretic. No erythema. No pallor.  Psychiatric: She has a normal mood and affect. Her behavior is normal. Judgment and thought content normal.  Nursing note and vitals reviewed.     Assessment & Plan:  1. Routine general medical examination at a health care facility - Encouraged heart healthy diet and exercise - One year for next CPE  - 6 month follow up or sooner if needed - Basic metabolic panel - CBC with Differential/Platelet - Hemoglobin A1c - Hepatic function panel - Lipid panel - TSH - Iron, TIBC and Ferritin Panel  2.  Essential hypertension - Will add Norvac 2.5 mg. Encouraged to monitor BP at home  - Return precautions given  - amLODipine (NORVASC) 2.5 MG tablet; Take 1 tablet (2.5 mg total) by mouth daily.  Dispense: 90 tablet; Refill: 3 - Basic metabolic panel - CBC with Differential/Platelet - Hemoglobin A1c - Hepatic function panel - Lipid panel - TSH  3. Hypothyroidism, unspecified type - Consider increase in Synthroid  - Basic metabolic panel - CBC with Differential/Platelet - Hemoglobin A1c - Hepatic function panel - Lipid panel - TSH   Dorothyann Peng, NP

## 2018-08-16 NOTE — Addendum Note (Signed)
Addended by: Apolinar Junes on: 08/16/2018 10:09 AM   Modules accepted: Level of Service

## 2018-08-16 NOTE — Telephone Encounter (Signed)
Updated patient on her labs.   TSH is elevated at 13. She reports that she has only been taking 1 pill instead of 1.5 MWF. - advised to add the 1/2 tab back in   Cholesterol panel is up - she wants to work on diet and exercise. Will retest in 6 months

## 2018-08-16 NOTE — Patient Instructions (Signed)
It was great seeing you today   I have send in a blood pressure medication called Norvasc. Take this daily.   I will let you know about your labs   Please follow up in 6 months or sooner if needed

## 2018-08-16 NOTE — Addendum Note (Signed)
Addended by: Miles Costain T on: 08/16/2018 10:35 AM   Modules accepted: Orders

## 2018-08-17 LAB — IRON,TIBC AND FERRITIN PANEL
%SAT: 21 % (ref 16–45)
Ferritin: 49 ng/mL (ref 16–232)
IRON: 64 ug/dL (ref 45–160)
TIBC: 307 ug/dL (ref 250–450)

## 2018-09-06 ENCOUNTER — Other Ambulatory Visit (INDEPENDENT_AMBULATORY_CARE_PROVIDER_SITE_OTHER): Payer: Self-pay

## 2018-09-06 ENCOUNTER — Telehealth (INDEPENDENT_AMBULATORY_CARE_PROVIDER_SITE_OTHER): Payer: Self-pay | Admitting: Orthopaedic Surgery

## 2018-09-06 MED ORDER — DICLOFENAC SODIUM 75 MG PO TBEC: 75.0000 mg | DELAYED_RELEASE_TABLET | Freq: Two times a day (BID) | ORAL | 3 refills | Status: AC

## 2018-09-06 MED ORDER — TRAMADOL HCL 50 MG PO TABS: ORAL_TABLET | ORAL | 0 refills | Status: AC

## 2018-09-06 NOTE — Telephone Encounter (Signed)
Called Rx (Tramadol) into pharm & faxed over Diclofenac.

## 2018-09-06 NOTE — Telephone Encounter (Signed)
See message.

## 2018-09-06 NOTE — Telephone Encounter (Signed)
Diclofenac #60.  3 refills #30 of tramadol

## 2018-09-06 NOTE — Telephone Encounter (Signed)
[address] [id number]   Medication refill   Diclofenac (Voltaren)75 mg tablet  Tramadol( Ultram) 50 mg tablet

## 2019-01-25 ENCOUNTER — Other Ambulatory Visit: Payer: Self-pay | Admitting: Adult Health

## 2019-01-25 NOTE — Telephone Encounter (Signed)
Sent to the pharmacy by e-scribe. 

## 2019-06-10 ENCOUNTER — Other Ambulatory Visit: Payer: Self-pay | Admitting: Endocrinology

## 2019-07-06 ENCOUNTER — Other Ambulatory Visit: Payer: Self-pay | Admitting: Endocrinology

## 2019-07-08 ENCOUNTER — Other Ambulatory Visit: Payer: Self-pay | Admitting: Endocrinology

## 2019-07-08 DIAGNOSIS — E890 Postprocedural hypothyroidism: Secondary | ICD-10-CM

## 2019-07-17 ENCOUNTER — Other Ambulatory Visit: Payer: Self-pay

## 2019-07-17 ENCOUNTER — Other Ambulatory Visit (INDEPENDENT_AMBULATORY_CARE_PROVIDER_SITE_OTHER): Payer: BLUE CROSS/BLUE SHIELD

## 2019-07-17 DIAGNOSIS — E890 Postprocedural hypothyroidism: Secondary | ICD-10-CM | POA: Diagnosis not present

## 2019-07-17 LAB — TSH: TSH: 0.2 u[IU]/mL — ABNORMAL LOW (ref 0.35–4.50)

## 2019-07-17 LAB — T4, FREE: Free T4: 1.14 ng/dL (ref 0.60–1.60)

## 2019-07-22 ENCOUNTER — Other Ambulatory Visit: Payer: Self-pay

## 2019-07-22 ENCOUNTER — Ambulatory Visit (INDEPENDENT_AMBULATORY_CARE_PROVIDER_SITE_OTHER): Payer: BLUE CROSS/BLUE SHIELD | Admitting: Endocrinology

## 2019-07-22 ENCOUNTER — Encounter: Payer: Self-pay | Admitting: Endocrinology

## 2019-07-22 DIAGNOSIS — E890 Postprocedural hypothyroidism: Secondary | ICD-10-CM

## 2019-07-22 MED ORDER — LEVOTHYROXINE SODIUM 175 MCG PO TABS: ORAL_TABLET | ORAL | 1 refills | Status: DC

## 2019-07-22 NOTE — Progress Notes (Signed)
patient ID: Cheryl Stone, female   DOB: 1966/04/03, 53 y.o.   MRN: [id number]            Reason for Appointment:  Hypothyroidism, follow-up visit  Today's office visit was provided via telemedicine using video technique The patient was explained the limitations of evaluation and management by telemedicine and the availability of in person appointments.  The patient understood the limitations and agreed to proceed. Patient also understood that the telehealth visit is billable. . Location of the patient: Patient's home . Location of the provider: Physician office Only the patient and myself were participating in the encounter     History of Present Illness:    Hypothyroidism was first diagnosed in 03/1998  At that time patient was having symptoms of  fatigue, cold sensitivity and weight loss. She was found to be hyperthyroid and she was also having significant difficulty swallowing. She was told that her thyroid was the size of a golf ball and she was having Graves' disease She was given radioactive iodine  Subsequently her labs showed that she was hypothyroid and she was started on thyroid supplements.  At that time she was feeling fairly well. She was followed regularly before she moved here and apparently her levothyroxine dose has been increased stepwise About 2 years ago she was taking 200 g and subsequently was on 224 g daily Subsequently has required lower doses with the last change made in 5/17 on her last visit  Recent history:   Previously in 03/2017 but because of her low normal TSH her dosage was reduced by half tablet weekly  Subsequently she has been taking 175 g levothyroxine, 7 tablets per week and on her last visit in 04/2018 her TSH was normal and she was continued on the same dose  Although her TSH was up to 13 in 08/2018 her PCP did not change her regimen and not clear why her level was abnormal, may have missed some doses  She may have occasional fatigue but  nothing unusual She is trying to do better with her weight and is having variable results No shakiness or palpitations, she does not check her blood pressure or pulse at home Recently weight was 274  She is regular with her Synthroid lately and takes it in the mornings before eating  TSH level is now normal normal at 0.2 without increase in free T4           Patient's weight history is as follows:  Wt Readings from Last 3 Encounters:  08/16/18 281 lb (127.5 kg)  04/19/18 268 lb 9.6 oz (121.8 kg)  01/31/18 258 lb (117 kg)    Thyroid function results have been as follows:  Lab Results  Component Value Date   TSH 0.20 (L) 07/17/2019   TSH 13.00 (H) 08/16/2018   TSH 2.17 04/13/2018   FREET4 1.14 07/17/2019   FREET4 0.83 04/13/2018   FREET4 1.03 10/09/2017   THYROID nodule: She had a thyroid ultrasound in 2015 by another physician  This showed a mostly solid 1.5 cm nodule on the right side.    Needle aspiration in 3/17 showed benign follicular nodule    Past Medical History:  Diagnosis Date  . Acid reflux   . Back pain   . Fluid retention   . Gallstones   . Graves disease    1999 had iodine radiation  . Graves disease    RAI ablation   . Hypertension   . Hypothyroidism   . Morbid obesity (  [place])   . Osteoarthritis     Past Surgical History:  Procedure Laterality Date  . CHOLECYSTECTOMY    . TUBAL LIGATION    . Uterine Ablation      Family History  Problem Relation Age of Onset  . Sudden Cardiac Death Mother        Saw her sister get killed, which led to heart attack  . Hypertension Maternal Grandmother   . Alzheimer's disease Maternal Grandfather   . Thyroid disease Paternal Grandmother   . Colon cancer Neg Hx     Social History:  reports that she has never smoked. She has never used smokeless tobacco. She reports current alcohol use. She reports that she does not use drugs.  Allergies:  Allergies  Allergen Reactions  . Lobster [Shellfish Allergy]  Rash    Lobster  . Tylenol [Acetaminophen] Hives and Swelling    Allergies as of 07/22/2019      Reactions   Lobster [shellfish Allergy] Rash   Lobster   Tylenol [acetaminophen] Hives, Swelling      Medication List       Accurate as of July 22, 2019  8:13 AM. If you have any questions, ask your nurse or doctor.        amLODipine 2.5 MG tablet Commonly known as: NORVASC Take 1 tablet (2.5 mg total) by mouth daily.   Cholecalciferol 125 MCG (5000 UT) capsule Take 1 capsule (5,000 Units total) by mouth daily.   diclofenac 75 MG EC tablet Commonly known as: VOLTAREN Take 1 tablet (75 mg total) by mouth 2 (two) times daily.   fluticasone 50 MCG/ACT nasal spray Commonly known as: FLONASE SPRAY 2 SPRAYS INTO EACH NOSTRIL EVERY DAY   hydrochlorothiazide 25 MG tablet Commonly known as: HYDRODIURIL TAKE 1 TABLET EVERY DAY AS NEEDED FOR EDEMA   levothyroxine 175 MCG tablet Commonly known as: SYNTHROID TAKE 1 TABLET (175MCG) BY MOUTH ONCE DAILY.) **MUST BE SEEN IN OFFICE WITH LABS FOR FUTURE REFILLS.**   Lorcaserin HCl 10 MG Tabs Commonly known as: Belviq Take 10 mg by mouth 2 (two) times daily.   methylPREDNISolone 4 MG Tbpk tablet Commonly known as: MEDROL DOSEPAK Medrol Dosepak.  Follow package directions.   traMADol 50 MG tablet Commonly known as: ULTRAM 1-2 tabs po tid prn pain         Review of Systems    She does not have a regular follow-up with her PCP for hypertension  BP Readings from Last 3 Encounters:  08/16/18 140/80  04/19/18 118/82  01/31/18 120/80                Examination:    There were no vitals taken for this visit.  No exam done  Assessment:  HYPOTHYROIDISM, post radioactive iodine treatment, reportedly for Graves' disease  She has required around 175 mcg daily for her levothyroxine dosage  Now taking 175 g once daily before breakfast and has been consistent  Overall symptomatically is doing well Her TSH is now  slightly low at 0.2, has not had any unusual weight change to explain this   PLAN:   She will continue 175 g, but will take 6-1/2 tablets a week  Follow-up in 3 months  Recommended regular follow-up for hypertension with her PCP   Elayne Snare 07/22/2019, 8:13 AM

## 2019-07-24 ENCOUNTER — Encounter: Payer: Self-pay | Admitting: Family Medicine

## 2019-07-24 ENCOUNTER — Other Ambulatory Visit: Payer: Self-pay | Admitting: Adult Health

## 2019-07-24 DIAGNOSIS — I10 Essential (primary) hypertension: Secondary | ICD-10-CM

## 2019-07-24 NOTE — Telephone Encounter (Signed)
Sent to the pharmacy by e-scribe for 90 days.  Letter released to MyChart.  She is due for cpx on/after 08/18/2019.

## 2019-08-13 ENCOUNTER — Telehealth (INDEPENDENT_AMBULATORY_CARE_PROVIDER_SITE_OTHER): Payer: BLUE CROSS/BLUE SHIELD | Admitting: Adult Health

## 2019-08-13 ENCOUNTER — Other Ambulatory Visit: Payer: Self-pay

## 2019-08-13 DIAGNOSIS — S39012A Strain of muscle, fascia and tendon of lower back, initial encounter: Secondary | ICD-10-CM

## 2019-08-13 MED ORDER — CYCLOBENZAPRINE HCL 5 MG PO TABS: 5.0000 mg | ORAL_TABLET | Freq: Three times a day (TID) | ORAL | 1 refills | Status: DC | PRN

## 2019-08-13 MED ORDER — METHYLPREDNISOLONE 4 MG PO TBPK: ORAL_TABLET | ORAL | 0 refills | Status: AC

## 2019-08-13 NOTE — Progress Notes (Signed)
Virtual Visit via Video Note  I connected with Cheryl Stone on 08/13/19 at  4:30 PM EDT by a video enabled telemedicine application and verified that I am speaking with the correct person using two identifiers.  Location patient: home Location provider:work or home office Persons participating in the virtual visit: patient, provider  I discussed the limitations of evaluation and management by telemedicine and the availability of in person appointments. The patient expressed understanding and agreed to proceed.   HPI: 53 year old female who is being evaluated today for an acute issue of low back pain.  Her back pain started 3 to 4 days ago, after she started exercising again.  Pain is described as an ache.  Pain does not radiate and she has no issues with urinary or fecal incontinence.  She has been taking Advil PM at night to help with the discomfort.  Pain is worse with certain movements such as bending and twisting as well as sitting for long periods of time   ROS: See pertinent positives and negatives per HPI.  Past Medical History:  Diagnosis Date  . Acid reflux   . Back pain   . Fluid retention   . Gallstones   . Graves disease    1999 had iodine radiation  . Graves disease    RAI ablation   . Hypertension   . Hypothyroidism   . Morbid obesity ([place])   . Osteoarthritis     Past Surgical History:  Procedure Laterality Date  . CHOLECYSTECTOMY    . TUBAL LIGATION    . Uterine Ablation      Family History  Problem Relation Age of Onset  . Sudden Cardiac Death Mother        Saw her sister get killed, which led to heart attack  . Hypertension Maternal Grandmother   . Alzheimer's disease Maternal Grandfather   . Thyroid disease Paternal Grandmother   . Colon cancer Neg Hx       Current Outpatient Medications:  .  amLODipine (NORVASC) 2.5 MG tablet, TAKE 1 TABLET BY MOUTH EVERY DAY, Disp: 90 tablet, Rfl: 0 .  Cholecalciferol 5000 units capsule, Take 1 capsule (5,000  Units total) by mouth daily., Disp: 90 capsule, Rfl: 2 .  diclofenac (VOLTAREN) 75 MG EC tablet, Take 1 tablet (75 mg total) by mouth 2 (two) times daily., Disp: 60 tablet, Rfl: 3 .  fluticasone (FLONASE) 50 MCG/ACT nasal spray, SPRAY 2 SPRAYS INTO EACH NOSTRIL EVERY DAY, Disp: 48 g, Rfl: 1 .  hydrochlorothiazide (HYDRODIURIL) 25 MG tablet, TAKE 1 TABLET EVERY DAY AS NEEDED FOR EDEMA, Disp: 90 tablet, Rfl: 0 .  levothyroxine (SYNTHROID) 175 MCG tablet, 1 tablet daily with half tablet on Sundays, Disp: 90 tablet, Rfl: 1 .  traMADol (ULTRAM) 50 MG tablet, 1-2 tabs po tid prn pain, Disp: 30 tablet, Rfl: 0  EXAM:  VITALS per patient if applicable:  GENERAL: alert, oriented, appears well and in no acute distress  HEENT: atraumatic, conjunttiva clear, no obvious abnormalities on inspection of external nose and ears  NECK: normal movements of the head and neck  LUNGS: on inspection no signs of respiratory distress, breathing rate appears normal, no obvious gross SOB, gasping or wheezing  CV: no obvious cyanosis  MS: moves all visible extremities without noticeable abnormality  PSYCH/NEURO: pleasant and cooperative, no obvious depression or anxiety, speech and thought processing grossly intact  ASSESSMENT AND PLAN:  Discussed the following assessment and plan:  No diagnosis found.     I  discussed the assessment and treatment plan with the patient. The patient was provided an opportunity to ask questions and all were answered. The patient agreed with the plan and demonstrated an understanding of the instructions.   The patient was advised to call back or seek an in-person evaluation if the symptoms worsen or if the condition fails to improve as anticipated.   Dorothyann Peng, NP

## 2019-10-17 ENCOUNTER — Other Ambulatory Visit: Payer: Self-pay | Admitting: Adult Health

## 2019-10-17 DIAGNOSIS — I10 Essential (primary) hypertension: Secondary | ICD-10-CM

## 2019-10-22 NOTE — Telephone Encounter (Signed)
30 day supply sent to the pharmacy by e-scribe.  Pt is past due for cpx and fasting lab work.

## 2019-10-25 ENCOUNTER — Other Ambulatory Visit (INDEPENDENT_AMBULATORY_CARE_PROVIDER_SITE_OTHER): Payer: BLUE CROSS/BLUE SHIELD

## 2019-10-25 ENCOUNTER — Other Ambulatory Visit: Payer: Self-pay

## 2019-10-25 DIAGNOSIS — E890 Postprocedural hypothyroidism: Secondary | ICD-10-CM

## 2019-10-25 NOTE — Addendum Note (Signed)
Addended by: Kaylyn Lim I on: 10/25/2019 03:29 PM   Modules accepted: Orders

## 2019-10-26 LAB — TSH: TSH: 1.47 u[IU]/mL (ref 0.450–4.500)

## 2019-10-26 LAB — T4, FREE: Free T4: 1.38 ng/dL (ref 0.82–1.77)

## 2019-10-29 ENCOUNTER — Ambulatory Visit: Payer: BLUE CROSS/BLUE SHIELD | Admitting: Endocrinology

## 2019-11-15 ENCOUNTER — Other Ambulatory Visit: Payer: Self-pay | Admitting: Adult Health

## 2019-11-15 DIAGNOSIS — I10 Essential (primary) hypertension: Secondary | ICD-10-CM

## 2019-11-19 NOTE — Telephone Encounter (Signed)
SENT TO THE PHARMACY BY E-SCRIBE. 

## 2019-12-04 ENCOUNTER — Other Ambulatory Visit: Payer: Self-pay

## 2019-12-04 ENCOUNTER — Encounter: Payer: Self-pay | Admitting: Endocrinology

## 2019-12-04 ENCOUNTER — Ambulatory Visit (INDEPENDENT_AMBULATORY_CARE_PROVIDER_SITE_OTHER): Payer: BLUE CROSS/BLUE SHIELD | Admitting: Endocrinology

## 2019-12-04 VITALS — BP 112/74 | HR 67 | Ht 66.0 in | Wt 293.8 lb

## 2019-12-04 DIAGNOSIS — E890 Postprocedural hypothyroidism: Secondary | ICD-10-CM | POA: Diagnosis not present

## 2019-12-04 NOTE — Progress Notes (Signed)
patient ID: Cheryl Stone, female   DOB: Jan 23, 1966, 53 y.o.   MRN: [id number]            Reason for Appointment:  Hypothyroidism, follow-up visit     History of Present Illness:    Hypothyroidism was first diagnosed in 03/1998  At that time patient was having symptoms of  fatigue, cold sensitivity and weight loss. She was found to be hyperthyroid and she was also having significant difficulty swallowing. She was told that her thyroid was the size of a golf ball and she was having Graves' disease She was given radioactive iodine  Subsequently her labs showed that she was hypothyroid and she was started on thyroid supplements.  At that time she was feeling fairly well. She was followed regularly before she moved here and apparently her levothyroxine dose has been increased stepwise About 2 years ago she was taking 200 g and subsequently was on 224 g daily Subsequently has required lower doses with the last change made in 5/17 on her last visit  Recent history:   She has had tendency to variable TSH levels over the last couple of years In 9/19 her TSH was 13 when seen by PCP and she did not have a dosage change She only had a follow-up here in 07/2019 subsequently; on the same dose her TSH was low at 0.2 Most recently she is taking 175 mcg levothyroxine, 6-1/2 tablets a week since 8/20  She does feel fairly good, periodically will have some fatigue but not unusual She does have some hair loss  She has had significant weight gain this year for various reasons, previously her weight was about 274  She is very consistent with taking her levothyroxine on waking up every morning and has not missed any doses lately  TSH level is now normal compared to 0.2 previously  Patient's weight history is as follows:  Wt Readings from Last 3 Encounters:  12/04/19 293 lb 12.8 oz (133.3 kg)  08/16/18 281 lb (127.5 kg)  04/19/18 268 lb 9.6 oz (121.8 kg)    Thyroid function results have been as  follows:  Lab Results  Component Value Date   TSH 1.470 10/25/2019   TSH 0.20 (L) 07/17/2019   TSH 13.00 (H) 08/16/2018   FREET4 1.38 10/25/2019   FREET4 1.14 07/17/2019   FREET4 0.83 04/13/2018   THYROID nodule: She had a thyroid ultrasound in 2015 by another physician  This showed a mostly solid 1.5 cm nodule on the right side.    Needle aspiration in 3/17 showed benign follicular nodule    Past Medical History:  Diagnosis Date  . Acid reflux   . Back pain   . Fluid retention   . Gallstones   . Graves disease    1999 had iodine radiation  . Graves disease    RAI ablation   . Hypertension   . Hypothyroidism   . Morbid obesity ([employer])   . Osteoarthritis     Past Surgical History:  Procedure Laterality Date  . CHOLECYSTECTOMY    . TUBAL LIGATION    . Uterine Ablation      Family History  Problem Relation Age of Onset  . Sudden Cardiac Death Mother        Saw her sister get killed, which led to heart attack  . Hypertension Maternal Grandmother   . Alzheimer's disease Maternal Grandfather   . Thyroid disease Paternal Grandmother   . Colon cancer Neg Hx     Social  History:  reports that she has never smoked. She has never used smokeless tobacco. She reports current alcohol use. She reports that she does not use drugs.  Allergies:  Allergies  Allergen Reactions  . Lobster [Shellfish Allergy] Rash    Lobster  . Tylenol [Acetaminophen] Hives and Swelling    Allergies as of 12/04/2019      Reactions   Lobster [shellfish Allergy] Rash   Lobster   Tylenol [acetaminophen] Hives, Swelling      Medication List       Accurate as of December 04, 2019  1:38 PM. If you have any questions, ask your nurse or doctor.        STOP taking these medications   cyclobenzaprine 5 MG tablet Commonly known as: FLEXERIL Stopped by: Elayne Snare, MD     TAKE these medications   amLODipine 2.5 MG tablet Commonly known as: NORVASC Take 1 tablet by mouth daily.   **DUE  FOR YEARLY PHYSICAL**   Cholecalciferol 125 MCG (5000 UT) capsule Take 1 capsule (5,000 Units total) by mouth daily.   diclofenac 75 MG EC tablet Commonly known as: VOLTAREN Take 1 tablet (75 mg total) by mouth 2 (two) times daily.   fluticasone 50 MCG/ACT nasal spray Commonly known as: FLONASE SPRAY 2 SPRAYS INTO EACH NOSTRIL EVERY DAY   hydrochlorothiazide 25 MG tablet Commonly known as: HYDRODIURIL TAKE 1 TABLET EVERY DAY AS NEEDED FOR EDEMA. **DUE FOR YEARLY PHYSICAL**   levothyroxine 175 MCG tablet Commonly known as: SYNTHROID 1 tablet daily with half tablet on Sundays   methylPREDNISolone 4 MG Tbpk tablet Commonly known as: MEDROL DOSEPAK Take as directed   tiZANidine 4 MG tablet Commonly known as: ZANAFLEX Take 4 mg by mouth 3 (three) times daily as needed for muscle spasms.   traMADol 50 MG tablet Commonly known as: ULTRAM 1-2 tabs po tid prn pain         Review of Systems    Followed by PCP for hypertension  BP Readings from Last 3 Encounters:  12/04/19 112/74  08/16/18 140/80  04/19/18 118/82   She recently starting to attend the weight loss clinic             Examination:    BP 112/74 (BP Location: Left Arm, Patient Position: Sitting, Cuff Size: Normal)   Pulse 67   Ht 5\' 6"  (1.676 m)   Wt 293 lb 12.8 oz (133.3 kg)   SpO2 97%   BMI 47.42 kg/m     Assessment:  HYPOTHYROIDISM, post radioactive iodine treatment, reportedly for Graves' disease  She has required around 175 mcg daily for her levothyroxine dosage She takes generic levothyroxine from [facility]  Now taking 175 g 6-1/2 tablets weekly With better compliance she appears to have a more stable dose Subjectively doing well Otherwise however she has had weight gain for other reasons  Discussed that her endogenous thyroid function is not present and she is dependent on her levothyroxine for proper supplementation  Her TSH is back to normal   PLAN:   She will continue 175 g,  6-1/2 tablets a week However discussed that if she has variability in her TSH levels again she may need to be switched to Unithroid or Synthroid  Encouraged her to start an exercise program when she is able to for weight loss  Follow-up in 6 months unless having any issues with unusual fatigue     Elayne Snare 12/04/2019, 1:38 PM

## 2020-01-19 ENCOUNTER — Other Ambulatory Visit: Payer: Self-pay | Admitting: Endocrinology

## 2020-01-30 ENCOUNTER — Encounter: Payer: BLUE CROSS/BLUE SHIELD | Admitting: Adult Health

## 2020-02-13 ENCOUNTER — Other Ambulatory Visit: Payer: Self-pay | Admitting: Adult Health

## 2020-02-13 DIAGNOSIS — I10 Essential (primary) hypertension: Secondary | ICD-10-CM

## 2020-02-13 NOTE — Telephone Encounter (Signed)
Sent to the pharmacy by e-scribe for 90 days.  Pt has upcoming cpx on 03/19/20.

## 2020-03-19 ENCOUNTER — Encounter: Payer: BLUE CROSS/BLUE SHIELD | Admitting: Adult Health

## 2020-05-10 ENCOUNTER — Other Ambulatory Visit: Payer: Self-pay | Admitting: Adult Health

## 2020-05-10 DIAGNOSIS — I10 Essential (primary) hypertension: Secondary | ICD-10-CM

## 2020-05-12 NOTE — Telephone Encounter (Signed)
DENIED.  HAS NOT SEEN [facility] SINCE 08/2018.

## 2020-05-20 ENCOUNTER — Encounter: Payer: BLUE CROSS/BLUE SHIELD | Admitting: Adult Health

## 2020-06-03 ENCOUNTER — Ambulatory Visit: Payer: BLUE CROSS/BLUE SHIELD | Admitting: Endocrinology

## 2020-07-06 ENCOUNTER — Other Ambulatory Visit: Payer: Self-pay | Admitting: Adult Health

## 2020-07-08 NOTE — Telephone Encounter (Signed)
DENIED.  PT IS PAST DUE FOR CPX AND LAB WORK.

## 2020-09-01 ENCOUNTER — Other Ambulatory Visit: Payer: Self-pay | Admitting: Endocrinology

## 2020-09-28 ENCOUNTER — Other Ambulatory Visit: Payer: Self-pay | Admitting: Endocrinology

## 2020-11-03 ENCOUNTER — Other Ambulatory Visit: Payer: Self-pay | Admitting: Endocrinology

## 2020-11-19 ENCOUNTER — Other Ambulatory Visit: Payer: Self-pay | Admitting: Endocrinology

## 2020-12-20 ENCOUNTER — Other Ambulatory Visit: Payer: Self-pay | Admitting: Endocrinology

## 2022-08-22 ENCOUNTER — Encounter: Payer: Self-pay | Admitting: Gastroenterology
